# Patient Record
Sex: Female | Born: 1970 | Race: Black or African American | Hispanic: No | Marital: Single | Smoking: Former smoker
Health system: Southern US, Community
[De-identification: ages and names within clinical notes are randomized; demographics above are authoritative.]

## PROBLEM LIST (undated history)

## (undated) DIAGNOSIS — M797 Fibromyalgia: Secondary | ICD-10-CM

## (undated) DIAGNOSIS — L309 Dermatitis, unspecified: Secondary | ICD-10-CM

## (undated) DIAGNOSIS — F4310 Post-traumatic stress disorder, unspecified: Secondary | ICD-10-CM

## (undated) DIAGNOSIS — J45909 Unspecified asthma, uncomplicated: Secondary | ICD-10-CM

## (undated) HISTORY — PX: HERNIA REPAIR: SHX51

## (undated) HISTORY — PX: ABDOMINAL HYSTERECTOMY: SHX81

## (undated) HISTORY — PX: WRIST SURGERY: SHX841

---

## 2002-07-10 ENCOUNTER — Emergency Department (HOSPITAL_COMMUNITY): Admission: EM | Admit: 2002-07-10 | Discharge: 2002-07-10 | Payer: Self-pay | Admitting: Emergency Medicine

## 2020-01-05 ENCOUNTER — Encounter (HOSPITAL_BASED_OUTPATIENT_CLINIC_OR_DEPARTMENT_OTHER): Payer: Self-pay

## 2020-01-05 ENCOUNTER — Emergency Department (HOSPITAL_BASED_OUTPATIENT_CLINIC_OR_DEPARTMENT_OTHER)
Admission: EM | Admit: 2020-01-05 | Discharge: 2020-01-05 | Disposition: A | Discharge status: Home / Self Care | Payer: Medicare Other | Attending: Emergency Medicine | Admitting: Emergency Medicine

## 2020-01-05 ENCOUNTER — Emergency Department (HOSPITAL_BASED_OUTPATIENT_CLINIC_OR_DEPARTMENT_OTHER): Payer: Medicare Other

## 2020-01-05 ENCOUNTER — Other Ambulatory Visit: Payer: Self-pay

## 2020-01-05 DIAGNOSIS — M545 Low back pain: Secondary | ICD-10-CM | POA: Insufficient documentation

## 2020-01-05 DIAGNOSIS — J45909 Unspecified asthma, uncomplicated: Secondary | ICD-10-CM | POA: Diagnosis not present

## 2020-01-05 DIAGNOSIS — M25561 Pain in right knee: Secondary | ICD-10-CM | POA: Insufficient documentation

## 2020-01-05 DIAGNOSIS — M542 Cervicalgia: Secondary | ICD-10-CM | POA: Insufficient documentation

## 2020-01-05 DIAGNOSIS — Z91040 Latex allergy status: Secondary | ICD-10-CM | POA: Diagnosis not present

## 2020-01-05 DIAGNOSIS — G8929 Other chronic pain: Secondary | ICD-10-CM | POA: Diagnosis not present

## 2020-01-05 DIAGNOSIS — M25512 Pain in left shoulder: Secondary | ICD-10-CM | POA: Insufficient documentation

## 2020-01-05 DIAGNOSIS — F17210 Nicotine dependence, cigarettes, uncomplicated: Secondary | ICD-10-CM | POA: Diagnosis not present

## 2020-01-05 HISTORY — DX: Dermatitis, unspecified: L30.9

## 2020-01-05 HISTORY — DX: Unspecified asthma, uncomplicated: J45.909

## 2020-01-05 HISTORY — DX: Fibromyalgia: M79.7

## 2020-01-05 HISTORY — DX: Post-traumatic stress disorder, unspecified: F43.10

## 2020-01-05 MED ORDER — DICLOFENAC SODIUM 1 % EX GEL: 2.0000 g | Freq: Four times a day (QID) | CUTANEOUS | 0 refills | Status: DC | PRN

## 2020-01-05 NOTE — ED Triage Notes (Signed)
Pt c/o pain to right knee, lower back, left shoulder and left side of neck x 3 days--denies injury to any site-NAD-slow limping gait

## 2020-01-05 NOTE — Discharge Instructions (Signed)
You were seen in the emergency room today with worsening pain in multiple locations including the right knee.  Your x-ray is normal.  I suspect that this is your fibromyalgia pain flaring up.  Your primary care doctor is trying to schedule a telehealth visit with you to discuss your pain management.  I will defer any additional pain medicines to them.  I am sending you home with a prescription for topical pain medications.  Please take these as directed and call your primary care doctor to arrange telehealth visit tomorrow.  In review of their telephone notes from today they can set you up with a same-day appointment.

## 2020-01-05 NOTE — ED Provider Notes (Signed)
Emergency Department Provider Note   I have reviewed the triage vital signs and the nursing notes.   HISTORY  Chief Complaint Multiple pain site c/o   HPI Tonya Chase is a 49 y.o. female with past medical history of chronic pain, fibromyalgia, eczema, asthma, presents to the emergency department with acute on chronic pain in the right knee, lower back, left shoulder.  Patient states that the pain in the lower back and left shoulder along with the left side of the neck feels typical of her fibromyalgia discomfort.  She has been having it over the past 3 days.  Patient states that she is been taking Lyrica but that her PCP took her off of a lot of her other pain medications approximately 1 year ago and she thinks she should be back on those.   In addition to her fibromyalgia type pain she is having intermittent severe pain in the left knee.  She feels this is mostly along the middle of the knee.  Denies known injury.  No fevers or chills.  No joint swelling.  She is ambulatory with a limp.    Past Medical History:  Diagnosis Date  . Asthma   . Eczema   . Fibromyalgia   . PTSD (post-traumatic stress disorder)     There are no problems to display for this patient.   Past Surgical History:  Procedure Laterality Date  . ABDOMINAL HYSTERECTOMY    . CESAREAN SECTION    . HERNIA REPAIR    . WRIST SURGERY      Allergies Contrast media [iodinated diagnostic agents], Latex, and Other  No family history on file.  Social History Social History   Tobacco Use  . Smoking status: Current Every Day Smoker    Types: Cigarettes  . Smokeless tobacco: Never Used  Substance Use Topics  . Alcohol use: Never  . Drug use: Never    Review of Systems  Constitutional: No fever/chills Cardiovascular: Denies chest pain. Respiratory: Denies shortness of breath. Gastrointestinal: No abdominal pain.  Genitourinary: Negative for dysuria. Musculoskeletal: Positive lower back and  left shoulder/neck pain. Positive right knee pain.  Skin: Negative for rash. Neurological: Negative for headaches, focal weakness or numbness.  10-point ROS otherwise negative.  ____________________________________________   PHYSICAL EXAM:  VITAL SIGNS: ED Triage Vitals  Enc Vitals Group     BP 01/05/20 2031 (!) 135/107     Pulse Rate 01/05/20 2031 85     Resp 01/05/20 2031 18     Temp 01/05/20 2031 98.4 F (36.9 C)     Temp Source 01/05/20 2031 Oral     SpO2 01/05/20 2031 100 %     Weight 01/05/20 2030 140 lb (63.5 kg)     Height 01/05/20 2030 5' (1.524 m)   Constitutional: Alert and oriented. Well appearing and in no acute distress. Eyes: Conjunctivae are normal.  Head: Atraumatic. Nose: No congestion/rhinnorhea. Mouth/Throat: Mucous membranes are moist.  Oropharynx non-erythematous. Neck: No stridor.   Cardiovascular: Normal rate, regular rhythm. Good peripheral circulation. Grossly normal heart sounds.   Respiratory: Normal respiratory effort.  No retractions. Lungs CTAB. Gastrointestinal: Soft and nontender. No distention.  Musculoskeletal: No lower extremity tenderness nor edema. No gross deformities of extremities. No joint effusion or edema.  Neurologic:  Normal speech and language. No gross focal neurologic deficits are appreciated.  Skin:  Skin is warm, dry and intact. No rash noted.  ____________________________________________  RADIOLOGY  DG Knee 2 Views Right  Result Date: 01/05/2020  CLINICAL DATA:  Right knee pain for 3 days EXAM: RIGHT KNEE - 1-2 VIEW COMPARISON:  None. FINDINGS: Frontal and lateral views of the right knee demonstrate no acute displaced fractures. Joint spaces are well preserved. No joint effusion. Soft tissues are unremarkable. IMPRESSION: 1. Unremarkable right knee. Electronically Signed   By: Sharlet Salina M.D.   On: 01/05/2020 22:06    ____________________________________________   PROCEDURES  Procedure(s) performed:    Procedures  None ____________________________________________   INITIAL IMPRESSION / ASSESSMENT AND PLAN / ED COURSE  Pertinent labs & imaging results that were available during my care of the patient were reviewed by me and considered in my medical decision making (see chart for details).   Patient presents to the ED with acute on chronic pain. Plain film of the right knee without acute finding. Discussed with patient that her PCP is attempting to set up a TH visit with her after review of phone encounters in Epic. She will call tomorrow and get on the [facility] schedule. No indication on exam today to suspect septic joint or other emergent issue here.    ____________________________________________  FINAL CLINICAL IMPRESSION(S) / ED DIAGNOSES  Final diagnoses:  Acute pain of right knee     NEW OUTPATIENT MEDICATIONS STARTED DURING THIS VISIT:  Discharge Medication List as of 01/05/2020 10:26 PM    START taking these medications   Details  diclofenac Sodium (VOLTAREN) 1 % GEL Apply 2 g topically 4 (four) times daily as needed (pain)., Starting Wed 01/05/2020, Print        Note:  This document was prepared using Dragon voice recognition software and may include unintentional dictation errors.  Alona Bene, MD, [facility] Emergency Medicine    Alpheus Stiff, Arlyss Repress, MD 01/07/20 1344

## 2020-06-21 ENCOUNTER — Encounter (HOSPITAL_BASED_OUTPATIENT_CLINIC_OR_DEPARTMENT_OTHER): Payer: Self-pay

## 2020-06-21 ENCOUNTER — Other Ambulatory Visit: Payer: Self-pay

## 2020-06-21 ENCOUNTER — Emergency Department (HOSPITAL_BASED_OUTPATIENT_CLINIC_OR_DEPARTMENT_OTHER)
Admission: EM | Admit: 2020-06-21 | Discharge: 2020-06-21 | Disposition: A | Discharge status: Home / Self Care | Payer: Medicare Other | Attending: Emergency Medicine | Admitting: Emergency Medicine

## 2020-06-21 ENCOUNTER — Encounter (HOSPITAL_BASED_OUTPATIENT_CLINIC_OR_DEPARTMENT_OTHER): Payer: Self-pay | Admitting: *Deleted

## 2020-06-21 DIAGNOSIS — R21 Rash and other nonspecific skin eruption: Secondary | ICD-10-CM | POA: Insufficient documentation

## 2020-06-21 DIAGNOSIS — Z91040 Latex allergy status: Secondary | ICD-10-CM | POA: Insufficient documentation

## 2020-06-21 DIAGNOSIS — L309 Dermatitis, unspecified: Secondary | ICD-10-CM | POA: Insufficient documentation

## 2020-06-21 DIAGNOSIS — F17210 Nicotine dependence, cigarettes, uncomplicated: Secondary | ICD-10-CM | POA: Insufficient documentation

## 2020-06-21 DIAGNOSIS — J45909 Unspecified asthma, uncomplicated: Secondary | ICD-10-CM | POA: Insufficient documentation

## 2020-06-21 DIAGNOSIS — Z5321 Procedure and treatment not carried out due to patient leaving prior to being seen by health care provider: Secondary | ICD-10-CM | POA: Insufficient documentation

## 2020-06-21 NOTE — ED Triage Notes (Signed)
Rash and itching x 2 days, sees dermatology for same

## 2020-06-21 NOTE — ED Triage Notes (Signed)
Pt c/o "itching all over-skin allergies" x 2 days-NAD-steady gait

## 2020-06-22 ENCOUNTER — Emergency Department (HOSPITAL_BASED_OUTPATIENT_CLINIC_OR_DEPARTMENT_OTHER)
Admission: EM | Admit: 2020-06-22 | Discharge: 2020-06-22 | Disposition: A | Discharge status: Home / Self Care | Payer: Medicare Other | Source: Home / Self Care | Attending: Emergency Medicine | Admitting: Emergency Medicine

## 2020-06-22 DIAGNOSIS — L309 Dermatitis, unspecified: Secondary | ICD-10-CM

## 2020-06-22 NOTE — Discharge Instructions (Addendum)
I do NOT see any evidence of scabies on your body. It is primarily a visual diagnosis. Please see attached handout. If you start noticing those symptoms in the key areas, see your primary doctor for further management.

## 2020-06-22 NOTE — ED Provider Notes (Signed)
[facility] EMERGENCY DEPARTMENT Provider Note   CSN: [id number] Arrival date & time: 06/21/20  2113     History Chief Complaint  Patient presents with  . Rash    Tonya Chase is a 50 y.o. female.  Had intermittent skin to skin contact with somebody who called her today is that she has scabies.  Said that she needed to get checked for scabies.  Patient does not have any new symptoms besides her eczematous symptoms.    Rash Location:  Full body Quality: not blistering   Severity:  Mild Timing:  Constant Progression:  Unchanged Chronicity:  New Context: not insect bite/sting   Relieved by:  None tried Worsened by:  Nothing Ineffective treatments:  None tried Associated symptoms: no abdominal pain        Past Medical History:  Diagnosis Date  . Asthma   . Eczema   . Fibromyalgia   . PTSD (post-traumatic stress disorder)     There are no problems to display for this patient.   Past Surgical History:  Procedure Laterality Date  . ABDOMINAL HYSTERECTOMY    . CESAREAN SECTION    . HERNIA REPAIR    . WRIST SURGERY       OB History   No obstetric history on file.     No family history on file.  Social History   Tobacco Use  . Smoking status: Current Every Day Smoker    Types: Cigarettes  . Smokeless tobacco: Never Used  Vaping Use  . Vaping Use: Never used  Substance Use Topics  . Alcohol use: Never  . Drug use: Never    Home Medications Prior to Admission medications   Medication Sig Start Date End Date Taking? Authorizing Provider  diclofenac Sodium (VOLTAREN) 1 % GEL Apply 2 g topically 4 (four) times daily as needed (pain). 01/05/20   Long, Arlyss Repress, MD    Allergies    Contrast media [iodinated diagnostic agents], Latex, and Other  Review of Systems   Review of Systems  Gastrointestinal: Negative for abdominal pain.  Skin: Positive for rash.  All other systems reviewed and are negative.   Physical Exam Updated Vital  Signs BP 126/79   Pulse 69   Temp 98.2 F (36.8 C)   Resp 16   Ht 5' (1.524 m)   Wt 54 kg   SpO2 100%   BMI 23.24 kg/m   Physical Exam Vitals and nursing note reviewed.  Constitutional:      Appearance: She is well-developed and well-nourished.  HENT:     Head: Normocephalic and atraumatic.     Mouth/Throat:     Pharynx: Oropharynx is clear.  Eyes:     Pupils: Pupils are equal, round, and reactive to light.  Cardiovascular:     Rate and Rhythm: Normal rate and regular rhythm.  Pulmonary:     Effort: No respiratory distress.     Breath sounds: No stridor.  Abdominal:     General: There is no distension.  Musculoskeletal:        General: No swelling or tenderness. Normal range of motion.     Cervical back: Normal range of motion.  Skin:    General: Skin is warm and dry.     Coloration: Skin is not jaundiced or pale.     Findings: Rash ( Diffuse eczematous rash.  No evidence of burrowing, mites or insect bites around her hands wrists arms armpits collar or other common places such as waistband.  Did not inspect her groin area.) present.  Neurological:     General: No focal deficit present.     Mental Status: She is alert.     ED Results / Procedures / Treatments   Labs (all labs ordered are listed, but only abnormal results are displayed) Labs Reviewed - No data to display  EKG None  Radiology No results found.  Procedures Procedures (including critical care time)  Medications Ordered in ED Medications - No data to display  ED Course  I have reviewed the triage vital signs and the nursing notes.  Pertinent labs & imaging results that were available during my care of the patient were reviewed by me and considered in my medical decision making (see chart for details).    MDM Rules/Calculators/A&P                          No evidence of scabies.  Just her normal eczema.  She will follow-up with her doctor if she notices any new lesions.  Final Clinical  Impression(s) / ED Diagnoses Final diagnoses:  Eczema, unspecified type    Rx / DC Orders ED Discharge Orders    None       Suheyb Raucci, Barbara Cower, MD 06/22/20 [phone]

## 2020-07-05 ENCOUNTER — Other Ambulatory Visit: Payer: Self-pay

## 2020-07-05 ENCOUNTER — Encounter (HOSPITAL_BASED_OUTPATIENT_CLINIC_OR_DEPARTMENT_OTHER): Payer: Self-pay | Admitting: *Deleted

## 2020-07-05 ENCOUNTER — Emergency Department (HOSPITAL_BASED_OUTPATIENT_CLINIC_OR_DEPARTMENT_OTHER)
Admission: EM | Admit: 2020-07-05 | Discharge: 2020-07-05 | Disposition: A | Discharge status: Home / Self Care | Payer: Medicare Other | Attending: Emergency Medicine | Admitting: Emergency Medicine

## 2020-07-05 DIAGNOSIS — J45909 Unspecified asthma, uncomplicated: Secondary | ICD-10-CM | POA: Diagnosis not present

## 2020-07-05 DIAGNOSIS — Z20822 Contact with and (suspected) exposure to covid-19: Secondary | ICD-10-CM | POA: Insufficient documentation

## 2020-07-05 DIAGNOSIS — R059 Cough, unspecified: Secondary | ICD-10-CM | POA: Diagnosis not present

## 2020-07-05 DIAGNOSIS — F17210 Nicotine dependence, cigarettes, uncomplicated: Secondary | ICD-10-CM | POA: Insufficient documentation

## 2020-07-05 DIAGNOSIS — Z91040 Latex allergy status: Secondary | ICD-10-CM | POA: Insufficient documentation

## 2020-07-05 NOTE — ED Triage Notes (Signed)
C/o non pro cough  X 6 days ago

## 2020-07-05 NOTE — ED Provider Notes (Signed)
[facility] EMERGENCY DEPARTMENT Provider Note   CSN: [id number] Arrival date & time: 07/05/20  1837     History No chief complaint on file.   Tonya Chase is a 50 y.o. female.  The history is provided by the patient.  URI Presenting symptoms: cough   Presenting symptoms: no ear pain, no fever and no sore throat   Cough:    Cough characteristics:  Non-productive   Severity:  Mild   Onset quality:  Gradual   Timing:  Intermittent   Chronicity:  New Severity:  Mild Onset quality:  Gradual Timing:  Intermittent Progression:  Waxing and waning Chronicity:  New Relieved by:  Nothing Worsened by:  Nothing Associated symptoms: no arthralgias        Past Medical History:  Diagnosis Date  . Asthma   . Eczema   . Fibromyalgia   . PTSD (post-traumatic stress disorder)     There are no problems to display for this patient.   Past Surgical History:  Procedure Laterality Date  . ABDOMINAL HYSTERECTOMY    . CESAREAN SECTION    . HERNIA REPAIR    . WRIST SURGERY       OB History   No obstetric history on file.     No family history on file.  Social History   Tobacco Use  . Smoking status: Current Every Day Smoker    Types: Cigarettes  . Smokeless tobacco: Never Used  Vaping Use  . Vaping Use: Never used  Substance Use Topics  . Alcohol use: Never  . Drug use: Never    Home Medications Prior to Admission medications   Medication Sig Start Date End Date Taking? Authorizing Provider  diclofenac Sodium (VOLTAREN) 1 % GEL Apply 2 g topically 4 (four) times daily as needed (pain). 01/05/20   Long, Arlyss Repress, MD    Allergies    Contrast media [iodinated diagnostic agents], Latex, and Other  Review of Systems   Review of Systems  Constitutional: Negative for chills and fever.  HENT: Negative for ear pain and sore throat.   Eyes: Negative for pain and visual disturbance.  Respiratory: Positive for cough. Negative for shortness of breath.    Cardiovascular: Negative for chest pain and palpitations.  Gastrointestinal: Negative for abdominal pain and vomiting.  Genitourinary: Negative for dysuria and hematuria.  Musculoskeletal: Negative for arthralgias and back pain.  Skin: Negative for color change and rash.  Neurological: Negative for seizures and syncope.  All other systems reviewed and are negative.   Physical Exam Updated Vital Signs BP 136/86   Pulse 94   Temp 98.1 F (36.7 C)   Resp 16   Ht 5' (1.524 m)   Wt 54 kg   SpO2 100%   BMI 23.24 kg/m   Physical Exam Vitals and nursing note reviewed.  Constitutional:      General: She is not in acute distress.    Appearance: She is well-developed and well-nourished.  HENT:     Head: Normocephalic and atraumatic.  Eyes:     Conjunctiva/sclera: Conjunctivae normal.  Cardiovascular:     Rate and Rhythm: Normal rate and regular rhythm.     Heart sounds: No murmur heard.   Pulmonary:     Effort: Pulmonary effort is normal. No respiratory distress.     Breath sounds: Normal breath sounds.  Abdominal:     Palpations: Abdomen is soft.     Tenderness: There is no abdominal tenderness.  Musculoskeletal:  General: No edema.     Cervical back: Neck supple.  Skin:    General: Skin is warm and dry.  Neurological:     Mental Status: She is alert.  Psychiatric:        Mood and Affect: Mood and affect normal.     ED Results / Procedures / Treatments   Labs (all labs ordered are listed, but only abnormal results are displayed) Labs Reviewed  SARS CORONAVIRUS 2 (TAT 6-24 HRS)    EKG None  Radiology No results found.  Procedures Procedures (including critical care time)  Medications Ordered in ED Medications - No data to display  ED Course  I have reviewed the triage vital signs and the nursing notes.  Pertinent labs & imaging results that were available during my care of the patient were reviewed by me and considered in my medical decision  making (see chart for details).    MDM Rules/Calculators/A&P                           Tonya Chase [age] with history of asthma who presents to the ED with cough.  Normal vitals.  No fever.  No respiratory distress. No SOB or CP. No concerns for ACS or PE. Here for COVID test.  Overall mild symptoms.  No signs of respiratory distress and normal work of breathing.  Tested for COVID.  Likely close contact.  Discharged in good condition.  Understands to request.  This chart was dictated using voice recognition software.  Despite best efforts to proofread,  errors can occur which can change the documentation meaning.   Tonya Chase was evaluated in Emergency Department on 07/05/2020 for the symptoms described in the history of present illness. She was evaluated in the context of the global COVID-19 pandemic, which necessitated consideration that the patient might be at risk for infection with the SARS-CoV-2 virus that causes COVID-19. Institutional protocols and algorithms that pertain to the evaluation of patients at risk for COVID-19 are in a state of rapid change based on information released by regulatory bodies including the CDC and federal and state organizations. These policies and algorithms were followed during the patient's care in the ED.     Final Clinical Impression(s) / ED Diagnoses Final diagnoses:  Cough    Rx / DC Orders ED Discharge Orders    None       Virgina Norfolk, DO 07/05/20 2004

## 2020-07-06 LAB — SARS CORONAVIRUS 2 (TAT 6-24 HRS): SARS Coronavirus 2: NEGATIVE

## 2020-10-20 ENCOUNTER — Other Ambulatory Visit (HOSPITAL_BASED_OUTPATIENT_CLINIC_OR_DEPARTMENT_OTHER): Payer: Self-pay

## 2020-10-20 ENCOUNTER — Emergency Department (HOSPITAL_BASED_OUTPATIENT_CLINIC_OR_DEPARTMENT_OTHER)
Admission: EM | Admit: 2020-10-20 | Discharge: 2020-10-20 | Disposition: A | Discharge status: Home / Self Care | Payer: Medicare Other | Attending: Emergency Medicine | Admitting: Emergency Medicine

## 2020-10-20 ENCOUNTER — Other Ambulatory Visit: Payer: Self-pay

## 2020-10-20 ENCOUNTER — Encounter (HOSPITAL_BASED_OUTPATIENT_CLINIC_OR_DEPARTMENT_OTHER): Payer: Self-pay | Admitting: Urology

## 2020-10-20 DIAGNOSIS — R21 Rash and other nonspecific skin eruption: Secondary | ICD-10-CM | POA: Diagnosis present

## 2020-10-20 DIAGNOSIS — L235 Allergic contact dermatitis due to other chemical products: Secondary | ICD-10-CM | POA: Diagnosis not present

## 2020-10-20 DIAGNOSIS — J45909 Unspecified asthma, uncomplicated: Secondary | ICD-10-CM | POA: Insufficient documentation

## 2020-10-20 DIAGNOSIS — Z91040 Latex allergy status: Secondary | ICD-10-CM | POA: Diagnosis not present

## 2020-10-20 DIAGNOSIS — Z87891 Personal history of nicotine dependence: Secondary | ICD-10-CM | POA: Insufficient documentation

## 2020-10-20 MED ORDER — PREDNISONE 20 MG PO TABS
40.0000 mg | ORAL_TABLET | Freq: Every day | ORAL | 0 refills | Status: DC
  Filled 2020-10-20: qty 10, 5d supply, fill #0

## 2020-10-20 MED ORDER — HYDROXYZINE HCL 25 MG PO TABS
25.0000 mg | ORAL_TABLET | Freq: Three times a day (TID) | ORAL | 0 refills | Status: AC | PRN
  Filled 2020-10-20: qty 30, 10d supply, fill #0

## 2020-10-20 MED ORDER — EUCERIN EX CREA
TOPICAL_CREAM | Freq: Two times a day (BID) | CUTANEOUS | 0 refills | Status: DC
Start: 2020-10-20 — End: 2022-01-26
  Filled 2020-10-20: qty 240, 30d supply, fill #0

## 2020-10-20 NOTE — ED Provider Notes (Signed)
[facility] EMERGENCY DEPARTMENT Provider Note   CSN: [id number] Arrival date & time: 10/20/20  9242     History Chief Complaint  Patient presents with  . Rash to Hands    Tonya Chase is a 50 y.o. female.  Patient is a 50 year old female with a history of PTSD, fibromyalgia, asthma and eczema who is presenting today with 4 to 5 days of worsening rash on bilateral hands with itching and swelling.  Patient reports that this started after being part of several funerals being in contact with a lot of people and doing excessive cleaning with multiple chemicals and wearing gloves repeatedly.  She has tried putting antifungal cream on her hands and using hydroxyzine but is not getting any better.  She is now starting to feel like she is itching all over her body.  She denies any shortness of breath, asthma-like symptoms.  She has had no fevers.  No contact with anything like poison ivy or poison oak.  The history is provided by the patient.       Past Medical History:  Diagnosis Date  . Asthma   . Eczema   . Fibromyalgia   . PTSD (post-traumatic stress disorder)     There are no problems to display for this patient.   Past Surgical History:  Procedure Laterality Date  . ABDOMINAL HYSTERECTOMY    . CESAREAN SECTION    . HERNIA REPAIR    . WRIST SURGERY       OB History   No obstetric history on file.     History reviewed. No pertinent family history.  Social History   Tobacco Use  . Smoking status: Former Games developer  . Smokeless tobacco: Never Used  Vaping Use  . Vaping Use: Every day  Substance Use Topics  . Alcohol use: Never  . Drug use: Never    Home Medications Prior to Admission medications   Medication Sig Start Date End Date Taking? Authorizing Provider  predniSONE (DELTASONE) 20 MG tablet Take 2 tablets (40 mg total) by mouth daily. 10/20/20  Yes Brena Windsor, Alphonzo Lemmings, MD  Triamcinolone Acetonide (TRIAMCINOLONE 0.1 % CREAM : EUCERIN) CREA Apply 1  application topically 2 (two) times daily. 10/20/20  Yes Gwyneth Sprout, MD  diclofenac Sodium (VOLTAREN) 1 % GEL Apply 2 g topically 4 (four) times daily as needed (pain). 01/05/20   Long, Arlyss Repress, MD  hydrOXYzine (ATARAX/VISTARIL) 25 MG tablet Take 1 tablet (25 mg total) by mouth 3 (three) times daily as needed. 10/20/20   Gwyneth Sprout, MD    Allergies    Contrast media [iodinated diagnostic agents], Latex, and Other  Review of Systems   Review of Systems  Skin:       Also she has noticed skin breakdown in between her toes on both feet where it is moist and sometimes weeps.  She has been putting athletes foot cream on this for the last 3 weeks  All other systems reviewed and are negative.   Physical Exam Updated Vital Signs BP 127/70 (BP Location: Right Arm)   Pulse 67   Temp 98.3 F (36.8 C) (Oral)   Resp 20   Ht 5' (1.524 m)   Wt 55.3 kg   SpO2 100%   BMI 23.83 kg/m   Physical Exam Vitals and nursing note reviewed.  Constitutional:      General: She is not in acute distress.    Appearance: She is well-developed.  HENT:     Head: Normocephalic and atraumatic.  Eyes:     Pupils: Pupils are equal, round, and reactive to light.  Cardiovascular:     Rate and Rhythm: Normal rate.  Pulmonary:     Effort: Pulmonary effort is normal. No respiratory distress.  Musculoskeletal:        General: No tenderness. Normal range of motion.     Comments: No edema  Skin:    General: Skin is warm and dry.     Findings: Rash present.     Comments: Bilateral hands have red erythematous papules, excoriation and mild swelling.  Mild given thickening of bilateral hands as well.  Some palm involvement but mostly the dorsum of the hands and in between the digits. Secondly in between the toes is skin peeling, moisture and breakdown.  No significant erythema or skin thickening.  Neurological:     Mental Status: She is alert and oriented to person, place, and time. Mental status is at  baseline.     Cranial Nerves: No cranial nerve deficit.  Psychiatric:        Mood and Affect: Mood normal.        Behavior: Behavior normal.     ED Results / Procedures / Treatments   Labs (all labs ordered are listed, but only abnormal results are displayed) Labs Reviewed - No data to display  EKG None  Radiology No results found.  Procedures Procedures   Medications Ordered in ED Medications - No data to display  ED Course  I have reviewed the triage vital signs and the nursing notes.  Pertinent labs & imaging results that were available during my care of the patient were reviewed by me and considered in my medical decision making (see chart for details).    MDM Rules/Calculators/A&P                          Pleasant 50 year old female presenting today with what appears to be contact dermatitis of bilateral hands.  Most likely related to some type of chemical exposure from excessive cleaning over the last week due to having multiple people she is cleaning up after and being at 2 different funerals.  It does not appear fungal in nature or bacterial.  Will give triamcinolone cream.  However patient also is having diffuse itching and rash may be related to stress.  Will give a short course of steroids.  Patient also has fungal infection between the toes and encouraged her to continue to use athletes foot cream that she has and follow-up with her doctor if not getting better.   Final Clinical Impression(s) / ED Diagnoses Final diagnoses:  Allergic dermatitis due to other chemical product    Rx / DC Orders ED Discharge Orders         Ordered    hydrOXYzine (ATARAX/VISTARIL) 25 MG tablet  3 times daily PRN        10/20/20 1000    predniSONE (DELTASONE) 20 MG tablet  Daily        10/20/20 1000    Triamcinolone Acetonide (TRIAMCINOLONE 0.1 % CREAM : EUCERIN) CREA  2 times daily        10/20/20 1000           Gwyneth Sprout, MD 10/20/20 1016

## 2020-10-20 NOTE — ED Triage Notes (Addendum)
Bumpy raised rash to bilateral hands that started 4 days ago, States h/0 skin issues, takes hydroxyzine tid with no relief. Reports itching, states similar rash to bilateral feet, rash not seen on feet at this time.  States flare up of rash noted after Stressful event of father passing away last week

## 2021-01-14 ENCOUNTER — Emergency Department (HOSPITAL_BASED_OUTPATIENT_CLINIC_OR_DEPARTMENT_OTHER): Payer: Medicare Other

## 2021-01-14 ENCOUNTER — Encounter (HOSPITAL_BASED_OUTPATIENT_CLINIC_OR_DEPARTMENT_OTHER): Payer: Self-pay | Admitting: Emergency Medicine

## 2021-01-14 ENCOUNTER — Other Ambulatory Visit: Payer: Self-pay

## 2021-01-14 ENCOUNTER — Emergency Department (HOSPITAL_BASED_OUTPATIENT_CLINIC_OR_DEPARTMENT_OTHER)
Admission: EM | Admit: 2021-01-14 | Discharge: 2021-01-14 | Disposition: A | Discharge status: Home / Self Care | Payer: Medicare Other | Attending: Emergency Medicine | Admitting: Emergency Medicine

## 2021-01-14 DIAGNOSIS — M542 Cervicalgia: Secondary | ICD-10-CM

## 2021-01-14 DIAGNOSIS — M546 Pain in thoracic spine: Secondary | ICD-10-CM | POA: Insufficient documentation

## 2021-01-14 DIAGNOSIS — M5412 Radiculopathy, cervical region: Secondary | ICD-10-CM

## 2021-01-14 DIAGNOSIS — Z91040 Latex allergy status: Secondary | ICD-10-CM | POA: Diagnosis not present

## 2021-01-14 DIAGNOSIS — Z87891 Personal history of nicotine dependence: Secondary | ICD-10-CM | POA: Insufficient documentation

## 2021-01-14 DIAGNOSIS — J45909 Unspecified asthma, uncomplicated: Secondary | ICD-10-CM | POA: Diagnosis not present

## 2021-01-14 MED ORDER — PREDNISONE 20 MG PO TABS: ORAL_TABLET | ORAL | 0 refills | Status: DC

## 2021-01-14 MED ORDER — KETOROLAC TROMETHAMINE 15 MG/ML IJ SOLN
30.0000 mg | Freq: Once | INTRAMUSCULAR | Status: AC
  Administered 2021-01-14: 30 mg via INTRAMUSCULAR
  Filled 2021-01-14: qty 2

## 2021-01-14 NOTE — ED Provider Notes (Signed)
[facility] EMERGENCY DEPARTMENT Provider Note   CSN: [id number] Arrival date & time: 01/14/21  1056     History No chief complaint on file.   Tonya Chase is a 50 y.o. female.  Patient presents to the emergency department today for evaluation of posterior neck pain and upper back pain.  This has been an ongoing problem for the patient.  Per her report and per her records it appears that she has seen [facility] medical clinic as well as her primary care provider in [place] for this problem.  She reports pain that started at the base of her posterior neck about 4 weeks ago.  She also points to the bilateral upper back, trapezius area, as the area where is the pain is worse.  Today she developed pain into the "bone" of the left upper arm and down into her left hand.  No associated weakness.  She also reports headache pain along the side of her left face and head.  She states that she feels "foggy" and has had decreased energy.  She states "I want my life back" and is tearful during H&P.  She denies any vision loss or changes. Patient denies signs of stroke including: facial droop, slurred speech, aphasia, weakness/numbness in extremities, imbalance/trouble walking.  Per patient report and PCP notes, she has been given steroid shots at an urgent care, multiple muscle relaxer medications with concern for overmedication.  She was also started on duloxetine at [facility] pain clinic.  PCP per appointment on 7/28 wanted her to stop Zanaflex, take only Flexeril at night.  They are to follow-up with her.  Per patient report, she has not had any imaging of her neck yet.  No history of surgeries.      Past Medical History:  Diagnosis Date   Asthma    Eczema    Fibromyalgia    PTSD (post-traumatic stress disorder)     There are no problems to display for this patient.   Past Surgical History:  Procedure Laterality Date   ABDOMINAL HYSTERECTOMY     CESAREAN SECTION     HERNIA  REPAIR     WRIST SURGERY       OB History   No obstetric history on file.     History reviewed. No pertinent family history.  Social History   Tobacco Use   Smoking status: Former   Smokeless tobacco: Never  Building services engineer Use: Every day  Substance Use Topics   Alcohol use: Never   Drug use: Never    Home Medications Prior to Admission medications   Medication Sig Start Date End Date Taking? Authorizing Provider  diclofenac Sodium (VOLTAREN) 1 % GEL Apply 2 g topically 4 (four) times daily as needed (pain). 01/05/20   Long, Arlyss Repress, MD  eucerin-triamcinolone cream Apply 1 application on to the skin 2 (two) times daily. 10/20/20   Gwyneth Sprout, MD  hydrOXYzine (ATARAX/VISTARIL) 25 MG tablet Take 1 tablet (25 mg total) by mouth 3 (three) times daily as needed. 10/20/20   Gwyneth Sprout, MD  predniSONE (DELTASONE) 20 MG tablet Take 2 tablets (40 mg total) by mouth daily. 10/20/20   Gwyneth Sprout, MD    Allergies    Contrast media [iodinated diagnostic agents], Latex, and Other  Review of Systems   Review of Systems  Constitutional:  Negative for fever.  HENT:  Negative for rhinorrhea and sore throat.   Eyes:  Negative for visual disturbance.  Respiratory:  Negative for  cough and shortness of breath.   Cardiovascular:  Negative for chest pain.  Gastrointestinal:  Negative for abdominal pain, diarrhea, nausea and vomiting.  Genitourinary:  Negative for dysuria, frequency, hematuria and urgency.  Musculoskeletal:  Positive for myalgias and neck pain.  Skin:  Positive for rash (chronic eczema).  Neurological:  Positive for headaches. Negative for facial asymmetry, speech difficulty, weakness and numbness.   Physical Exam Updated Vital Signs BP 130/82 (BP Location: Right Arm)   Pulse (!) 101   Temp 98.4 F (36.9 C) (Oral)   Resp 20   Ht 5' (1.524 m)   Wt 56.7 kg   SpO2 99%   BMI 24.41 kg/m   Physical Exam Vitals and nursing note reviewed.   Constitutional:      General: She is not in acute distress.    Appearance: She is well-developed.  HENT:     Head: Normocephalic and atraumatic.     Right Ear: External ear normal.     Left Ear: External ear normal.     Nose: Nose normal.  Eyes:     Conjunctiva/sclera: Conjunctivae normal.  Neck:     Comments: No carotid bruit Cardiovascular:     Rate and Rhythm: Normal rate and regular rhythm.     Heart sounds: No murmur heard. Pulmonary:     Effort: No respiratory distress.     Breath sounds: No wheezing, rhonchi or rales.  Abdominal:     Palpations: Abdomen is soft.     Tenderness: There is no abdominal tenderness. There is no guarding or rebound.  Musculoskeletal:     Cervical back: Normal range of motion and neck supple. Tenderness present.     Thoracic back: Tenderness present.     Lumbar back: No tenderness.       Back:     Right lower leg: No edema.     Left lower leg: No edema.     Comments: Patient winces and appears uncomfortable with palpation of the bilateral paraspinous muscular area of the cervical spine as well as the upper thoracic spine.  During exam as patient is talking, she voluntarily began frequently shakes her head yes and no without any apparent discomfort or difficulty.  Skin:    General: Skin is warm and dry.     Findings: No rash.  Neurological:     General: No focal deficit present.     Mental Status: She is alert. Mental status is at baseline.     Motor: No weakness.  Psychiatric:        Mood and Affect: Mood normal.    ED Results / Procedures / Treatments   Labs (all labs ordered are listed, but only abnormal results are displayed) Labs Reviewed - No data to display  EKG EKG Interpretation  Date/Time:  Sunday January 14 2021 11:25:23 EDT Ventricular Rate:  96 PR Interval:  124 QRS Duration: 76 QT Interval:  366 QTC Calculation: 462 R Axis:   78 Text Interpretation: Normal sinus rhythm Normal ECG Normal axis No acute ischemia  Confirmed by Pieter Partridge ([id number]) on 01/14/2021 11:26:34 AM  Radiology DG Cervical Spine Complete  Result Date: 01/14/2021 CLINICAL DATA:  Posterior neck pain EXAM: CERVICAL SPINE - COMPLETE 4+ VIEW COMPARISON:  None. FINDINGS: Diffuse degenerative disc disease, advanced from C4-5 through C7-T1. Moderate bilateral degenerative facet disease. Uncovertebral spurring causes mild bilateral neural foraminal narrowing at C4-5 and C5-6. No fracture. Prevertebral soft tissues normal. Normal alignment. IMPRESSION: Degenerative disc and facet disease.  No  acute bony abnormality. Electronically Signed   By: Charlett Nose M.D.   On: 01/14/2021 13:25    Procedures Procedures   Medications Ordered in ED Medications  ketorolac (TORADOL) 15 MG/ML injection 30 mg (30 mg Intramuscular Given 01/14/21 1231)    ED Course  I have reviewed the triage vital signs and the nursing notes.  Pertinent labs & imaging results that were available during my care of the patient were reviewed by me and considered in my medical decision making (see chart for details).  Patient seen and examined.  IM Toradol ordered.  Patient's history and exam suggestive of musculoskeletal pain, possible component of cervical radiculopathy on the left side.  She is not weak today.  No concerning symptoms of central cord syndrome.  I do not feel that she requires emergent MRI today.  Will obtain plain films.  Patient is already been seen and treated with several modalities.  She may benefit from continued work-up for cervical radiculopathy as as outpatient.  Vital signs reviewed and are as follows: BP 130/82 (BP Location: Right Arm)   Pulse (!) 101   Temp 98.4 F (36.9 C) (Oral)   Resp 20   Ht 5' (1.524 m)   Wt 56.7 kg   SpO2 99%   BMI 24.41 kg/m   Cervical spine imaging suggestive of arthritis.  Patient updated on results.  She appears more comfortable after IM Toradol.  Plan 12-day taper prednisone.  We will give contact information for  neurosurgery in case she would like to follow-up with them.  I did try to discuss with patient, family medicine providers concern over her taking too much muscle relaxer.  Patient obviously not happy with care she received the other day and states that she will not be going back there.  Encouraged her to follow-up with neurosurgery, pain clinic, for further work-up of her neck and arm symptoms.    MDM Rules/Calculators/A&P                           Patient with ongoing neck pain, no chronic.  She has not received much improvement from anti-inflammatories, muscle relaxers.  Plain film imaging today with evidence of arthritis.  Radiation of pain into the arm with some paresthesias suggestive of cervical radiculopathy.  She will be trialed on a 12-day course of prednisone.  She will need follow-up.  She could potentially require MRI in order to better define possible cervical pathology.  No indication for emergent MRI or admission today.  No focal neurologic deficits or weakness.  No strokelike symptoms to suggest vascular dissection.   Final Clinical Impression(s) / ED Diagnoses Final diagnoses:  Neck pain  Cervical radiculopathy    Rx / DC Orders ED Discharge Orders          Ordered    predniSONE (DELTASONE) 20 MG tablet        01/14/21 1355             Renne Crigler, PA-C 01/14/21 1400    Koleen Distance, MD 01/14/21 [phone]

## 2021-01-14 NOTE — ED Triage Notes (Signed)
Pt arrives pov with c/o posterior neck pain, radiating to L shoulder and into arm and L ear. Pt reports treatment at [facility] x 2 visits, given muscle relaxer and cortisone injection with little relief. Pt Denies CP. Reports flexeril and meloxicam pta.

## 2021-01-14 NOTE — ED Notes (Signed)
Pt talking on phone; NAD; sts pain unchanged

## 2021-01-14 NOTE — Discharge Instructions (Signed)
Please read and follow all provided instructions.  Your diagnoses today include:  1. Cervical radiculopathy   2. Neck pain     Tests performed today include: Vital signs - see below for your results today X-ray - shows signs of arthritis in your neck  Medications prescribed:  Prednisone - steroid medicine   It is best to take this medication in the morning to prevent sleeping problems. If you are diabetic, monitor your blood sugar closely and stop taking Prednisone if blood sugar is over 300. Take with food to prevent stomach upset.   Take any prescribed medications only as directed.  Home care instructions:  Follow any educational materials contained in this packet Please rest, use ice or heat on your back for the next several days Do not lift, push, pull anything more than 10 pounds for the next week  Follow-up instructions: Please follow-up with your primary care provider and/or the neurosurgeon listed for further evaluation of your symptoms.   Return instructions:  SEEK IMMEDIATE MEDICAL ATTENTION IF YOU HAVE: New numbness, tingling, weakness, or problem with the use of your arms or legs Severe back pain not relieved with medications Loss control of your bowels or bladder Increasing pain in any areas of the body (such as chest or abdominal pain) Shortness of breath, dizziness, or fainting.  Worsening nausea (feeling sick to your stomach), vomiting, fever, or sweats Any other emergent concerns regarding your health   Additional Information:  Your vital signs today were: BP 117/81 (BP Location: Right Arm)   Pulse 74   Temp 98.4 F (36.9 C) (Oral)   Resp 18   Ht 5' (1.524 m)   Wt 56.7 kg   SpO2 100%   BMI 24.41 kg/m  If your blood pressure (BP) was elevated above 135/85 this visit, please have this repeated by your doctor within one month. --------------

## 2021-01-17 ENCOUNTER — Other Ambulatory Visit (HOSPITAL_BASED_OUTPATIENT_CLINIC_OR_DEPARTMENT_OTHER): Payer: Self-pay

## 2021-04-25 ENCOUNTER — Encounter: Payer: Self-pay | Admitting: Family Medicine

## 2021-04-25 ENCOUNTER — Ambulatory Visit (INDEPENDENT_AMBULATORY_CARE_PROVIDER_SITE_OTHER): Payer: Medicare Other | Admitting: Family Medicine

## 2021-04-25 VITALS — BP 122/90 | Ht 60.0 in | Wt 131.0 lb

## 2021-04-25 DIAGNOSIS — M5412 Radiculopathy, cervical region: Secondary | ICD-10-CM | POA: Diagnosis not present

## 2021-04-25 MED ORDER — MELOXICAM 7.5 MG PO TABS: 7.5000 mg | ORAL_TABLET | Freq: Two times a day (BID) | ORAL | 1 refills | Status: DC | PRN

## 2021-04-25 MED ORDER — HYDROCODONE-ACETAMINOPHEN 5-325 MG PO TABS: 1.0000 | ORAL_TABLET | Freq: Three times a day (TID) | ORAL | 0 refills | Status: DC | PRN

## 2021-04-25 NOTE — Assessment & Plan Note (Signed)
Acute on chronic in nature.  She has neurological changes on exam and has tried medications and home therapy with symptoms beginning in July. -Counseled on home exercise therapy and supportive care. - Norco. -Mobic. -MRI to evaluate for nerve impingement and for the consideration of an epidural

## 2021-04-25 NOTE — Patient Instructions (Signed)
Nice to meet you Please try heat on the area  Please use mobic. Do not take with ibuprofen  Please use the norco for severe pain.  [facility]   Please send me a message in MyChart with any questions or updates.  We will setup a virtual visit once the MRI is returned.   --Dr. Jordan Likes

## 2021-04-25 NOTE — Progress Notes (Signed)
  Tonya Chase - 50 y.o. female MRN [id number]  Date of birth: 1970-09-20  SUBJECTIVE:  Including CC & ROS.  No chief complaint on file.   Tonya Chase is a 50 y.o. female that is presenting with acute on chronic left-sided neck and arm pain.  Pain is been ongoing since July 11.  Has tried muscle relaxers and anti-inflammatories as well as intramuscular injections from her primary care.  No inciting event or trauma.  No history of surgery..  Independent review of the cervical spine x-ray from 7/31 shows degenerative changes of C4-5 and C5-6.   Review of Systems See HPI   HISTORY: Past Medical, Surgical, Social, and Family History Reviewed & Updated per EMR.   Pertinent Historical Findings include:  Past Medical History:  Diagnosis Date   Asthma    Eczema    Fibromyalgia    PTSD (post-traumatic stress disorder)     Past Surgical History:  Procedure Laterality Date   ABDOMINAL HYSTERECTOMY     CESAREAN SECTION     HERNIA REPAIR     WRIST SURGERY      History reviewed. No pertinent family history.  Social History   Socioeconomic History   Marital status: Single    Spouse name: Not on file   Number of children: Not on file   Years of education: Not on file   Highest education level: Not on file  Occupational History   Not on file  Tobacco Use   Smoking status: Former   Smokeless tobacco: Never  Vaping Use   Vaping Use: Every day  Substance and Sexual Activity   Alcohol use: Never   Drug use: Never   Sexual activity: Not on file  Other Topics Concern   Not on file  Social History Narrative   Not on file   Social Determinants of Health   Financial Resource Strain: Not on file  Food Insecurity: Not on file  Transportation Needs: Not on file  Physical Activity: Not on file  Stress: Not on file  Social Connections: Not on file  Intimate Partner Violence: Not on file     PHYSICAL EXAM:  VS: BP 122/90 (BP Location: Right Arm, Patient Position:  Sitting)   Ht 5' (1.524 m)   Wt 131 lb (59.4 kg)   BMI 25.58 kg/m  Physical Exam Gen: NAD, alert, cooperative with exam, well-appearing MSK:  Neck: Limited range of motion laterally to the left. Weakness with grip strength. Loss of biceps reflex. Limb weakness with resistance to wrist extension and flexion. Weakness with elbow flexion and extension. Positive Spurling's test.   ASSESSMENT & PLAN:   Cervical radiculopathy Acute on chronic in nature.  She has neurological changes on exam and has tried medications and home therapy with symptoms beginning in July. -Counseled on home exercise therapy and supportive care. - Norco. -Mobic. -MRI to evaluate for nerve impingement and for the consideration of an epidural

## 2021-04-28 ENCOUNTER — Ambulatory Visit (HOSPITAL_BASED_OUTPATIENT_CLINIC_OR_DEPARTMENT_OTHER)
Admission: RE | Admit: 2021-04-28 | Discharge: 2021-04-28 | Disposition: A | Discharge status: Home / Self Care | Payer: Medicare Other | Source: Ambulatory Visit | Attending: Family Medicine | Admitting: Family Medicine

## 2021-04-28 ENCOUNTER — Other Ambulatory Visit: Payer: Self-pay

## 2021-04-28 DIAGNOSIS — M5412 Radiculopathy, cervical region: Secondary | ICD-10-CM | POA: Diagnosis present

## 2021-05-01 ENCOUNTER — Encounter: Payer: Self-pay | Admitting: Family Medicine

## 2021-05-01 ENCOUNTER — Telehealth (INDEPENDENT_AMBULATORY_CARE_PROVIDER_SITE_OTHER): Payer: Medicare Other | Admitting: Family Medicine

## 2021-05-01 DIAGNOSIS — M5412 Radiculopathy, cervical region: Secondary | ICD-10-CM | POA: Diagnosis not present

## 2021-05-01 NOTE — Assessment & Plan Note (Signed)
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 -Counseled on home exercise therapy and supportive care. -Epidural

## 2021-05-01 NOTE — Progress Notes (Signed)
Virtual Visit via Video Note  I connected with Tonya Chase on 05/01/21 at  8:10 AM EST by a video enabled telemedicine application and verified that I am speaking with the correct person using two identifiers.  Location: Patient: home Provider: office   I discussed the limitations of evaluation and management by telemedicine and the availability of in person appointments. The patient expressed understanding and agreed to proceed.  History of Present Illness:  Tonya Chase is a 50 year old female that is following up after the MRI of her cervical spine.  She continues to have radicular pain down the left arm.  MRI was revealing for foraminal stenosis on the left side that is more moderate that is likely the source of her pain.  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 -Counseled on home exercise therapy and supportive care. -Epidural  Follow Up Instructions:    I discussed the assessment and treatment plan with the patient. The patient was provided an opportunity to ask questions and all were answered. The patient agreed with the plan and demonstrated an understanding of the instructions.   The patient was advised to call back or seek an in-person evaluation if the symptoms worsen or if the condition fails to improve as anticipated.    Clare Gandy, MD

## 2021-05-02 ENCOUNTER — Telehealth: Payer: Self-pay | Admitting: Family Medicine

## 2021-05-02 ENCOUNTER — Inpatient Hospital Stay
Admission: RE | Admit: 2021-05-02 | Discharge: 2021-05-02 | Disposition: A | Discharge status: Home / Self Care | Payer: Medicare Other | Source: Ambulatory Visit | Attending: Family Medicine | Admitting: Family Medicine

## 2021-05-02 NOTE — Telephone Encounter (Signed)
Pt cld states was unable to get  MRI  or Epidural @ GI due to them requiring her being accompanied by helper(per pt she has No one to go with her) she has been advised to contact her county [employer] for assistance since no family in [place] to assist her.--pt very dramatic regard issue w/ neck & arm pain .  ---Patient  ask if we can find a Provider in [place] that will perform the MRI & Epidural due to her predicament.  Per order below: (MRI to evaluate for nerve impingement and for the consideration of an epidural)  --forwarding request to med asst.   --glh

## 2021-05-02 NOTE — Discharge Instructions (Signed)

## 2021-05-03 NOTE — Telephone Encounter (Signed)
Left message for Muddy @ [facility] to call me back re: below.

## 2021-05-04 NOTE — Telephone Encounter (Signed)
[place] from [facility] called and left a message stating they do not offer transportation services for patients. Please advise.

## 2021-05-07 NOTE — Telephone Encounter (Signed)
Pt informed of below.  She states she will work on it, but she really has no one who can or is willing to ride with her. She states it will be easier to get a driver if her epidural is done in [place]. Is there anywhere in [facility] she can have the epidural done at?

## 2021-12-07 ENCOUNTER — Other Ambulatory Visit: Payer: Self-pay

## 2021-12-07 ENCOUNTER — Emergency Department (HOSPITAL_BASED_OUTPATIENT_CLINIC_OR_DEPARTMENT_OTHER)
Admission: EM | Admit: 2021-12-07 | Discharge: 2021-12-07 | Disposition: A | Discharge status: Home / Self Care | Payer: Medicare Other | Attending: Emergency Medicine | Admitting: Emergency Medicine

## 2021-12-07 ENCOUNTER — Encounter (HOSPITAL_BASED_OUTPATIENT_CLINIC_OR_DEPARTMENT_OTHER): Payer: Self-pay

## 2021-12-07 DIAGNOSIS — J45909 Unspecified asthma, uncomplicated: Secondary | ICD-10-CM | POA: Diagnosis not present

## 2021-12-07 DIAGNOSIS — Z91040 Latex allergy status: Secondary | ICD-10-CM | POA: Insufficient documentation

## 2021-12-07 DIAGNOSIS — Z7951 Long term (current) use of inhaled steroids: Secondary | ICD-10-CM | POA: Insufficient documentation

## 2021-12-07 DIAGNOSIS — R21 Rash and other nonspecific skin eruption: Secondary | ICD-10-CM | POA: Diagnosis not present

## 2021-12-07 MED ORDER — PREDNISONE 10 MG (21) PO TBPK: ORAL_TABLET | Freq: Every day | ORAL | 0 refills | Status: DC

## 2021-12-07 NOTE — ED Triage Notes (Signed)
Rash x 3 weeks to left leg and left arm and low back. Describes as itchy.

## 2021-12-07 NOTE — ED Provider Notes (Signed)
[facility] EMERGENCY DEPARTMENT Provider Note   CSN: [id number] Arrival date & time: 12/07/21  2054     History  Chief Complaint  Patient presents with   Rash    Tonya Chase is a 51 y.o. female asthma, fibromyalgia, eczema, PTSD.  Presents to the emergency department with a chief plaint of rash.  Patient reports that she developed a new rash to her left lower leg, left arm, and back over the last 3 weeks.  Patient endorses pruritus to rash.  Patient states that this rash appears different from her eczema that she does without a regular basis.  Patient reports that she has been using her prescribed triamcinolone cream on this rash with no improvement in symptoms.  Patient has also been taking hydroxyzine with no improvement in her pruritus.  Patient states that she has not been able to follow-up with her dermatologist but does have an appointment scheduled for September.  Patient denies any exposure to new personal hygiene products, cleaning products, pets or animals.  Patient denies any new medications.  Denies any fever, chills, purulent discharge, nausea, vomiting, diarrhea, trouble swallowing, trouble breathing. ,  IV drug use.   Rash Associated symptoms: no abdominal pain, no fever, no nausea, no shortness of breath and not vomiting        Home Medications Prior to Admission medications   Medication Sig Start Date End Date Taking? Authorizing Provider  diclofenac Sodium (VOLTAREN) 1 % GEL Apply 2 g topically 4 (four) times daily as needed (pain). 01/05/20   Long, Arlyss Repress, MD  eucerin-triamcinolone cream Apply 1 application on to the skin 2 (two) times daily. 10/20/20   Gwyneth Sprout, MD  HYDROcodone-acetaminophen (NORCO/VICODIN) 5-325 MG tablet Take 1 tablet by mouth every 8 (eight) hours as needed. 04/25/21   Myra Rude, MD  hydrOXYzine (ATARAX/VISTARIL) 25 MG tablet Take 1 tablet (25 mg total) by mouth 3 (three) times daily as needed. 10/20/20   Gwyneth Sprout, MD  meloxicam (MOBIC) 7.5 MG tablet Take 1 tablet (7.5 mg total) by mouth 2 (two) times daily as needed. 04/25/21   Myra Rude, MD  predniSONE (DELTASONE) 20 MG tablet 3 Tabs PO Days 1-3, then 2 tabs PO Days 4-6, then 1 tab PO Day 7-9, then Half Tab PO Day 10-12 01/14/21   Renne Crigler, PA-C      Allergies    Latex and Other    Review of Systems   Review of Systems  Constitutional:  Negative for chills and fever.  HENT:  Negative for facial swelling.   Respiratory:  Negative for shortness of breath.   Gastrointestinal:  Negative for abdominal pain, nausea and vomiting.  Musculoskeletal:  Negative for back pain and neck pain.  Skin:  Positive for rash. Negative for color change, pallor and wound.  Psychiatric/Behavioral:  Negative for confusion.     Physical Exam Updated Vital Signs BP 133/85 (BP Location: Left Arm)   Pulse 79   Temp 98.1 F (36.7 C) (Oral)   Resp 18   Ht 5' (1.524 m)   Wt 63.5 kg   SpO2 98%   BMI 27.34 kg/m  Physical Exam Vitals and nursing note reviewed.  Constitutional:      General: She is not in acute distress.    Appearance: She is not ill-appearing, toxic-appearing or diaphoretic.  HENT:     Head: Normocephalic.  Eyes:     General: No scleral icterus.       Right eye:  No discharge.        Left eye: No discharge.  Cardiovascular:     Rate and Rhythm: Normal rate.  Pulmonary:     Effort: Pulmonary effort is normal.  Skin:    General: Skin is warm and dry.     Findings: Petechiae and rash present. Rash is papular. Rash is not crusting, macular, nodular, purpuric, pustular, scaling, urticarial or vesicular.     Comments: Papular rash to left lower extremity, left antecubitus space, and left back as photographed below.  No rash to oral mucosa, palms, or soles of feet.  Neurological:     General: No focal deficit present.     Mental Status: She is alert.     GCS: GCS eye subscore is 4. GCS verbal subscore is 5. GCS motor subscore  is 6.  Psychiatric:        Behavior: Behavior is cooperative.           ED Results / Procedures / Treatments   Labs (all labs ordered are listed, but only abnormal results are displayed) Labs Reviewed - No data to display  EKG None  Radiology No results found.  Procedures Procedures    Medications Ordered in ED Medications - No data to display  ED Course/ Medical Decision Making/ A&P                           Medical Decision Making  Alert 51 year old female no acute distress, nontoxic-appearing.  Presents emerged department complaint of rash.  Information is obtained from patient.  I reviewed patient's past medical records including previous ER notes, labs, imaging.  Patient has medical history as outlined in HPI which complicates her care.  Patient has papular rash as described and photographed above.  No surrounding crusting, purulent discharge, erythema or warmth to suggest bacterial infection.  No petechia or purpura.  We will trial patient on steroid taper.  Patient to follow-up with PCP/dermatologist for repeat evaluation.  Patient care discussed with attending physician Dr. Jacqulyn Bath.  Based on patient's chief complaint, I considered admission might be necessary, however after reassuring ED workup feel patient is reasonable for discharge.  Discussed results, findings, treatment and follow up. Patient advised of return precautions. Patient verbalized understanding and agreed with plan.  Portions of this note were generated with Scientist, clinical (histocompatibility and immunogenetics). Dictation errors may occur despite best attempts at proofreading.         Final Clinical Impression(s) / ED Diagnoses Final diagnoses:  None    Rx / DC Orders ED Discharge Orders     None         Berneice Heinrich 12/07/21 2152    Maia Plan, MD 12/07/21 2315

## 2022-01-21 ENCOUNTER — Ambulatory Visit (HOSPITAL_COMMUNITY)
Admission: AD | Admit: 2022-01-21 | Discharge: 2022-01-21 | Disposition: A | Discharge status: Home / Self Care | Payer: Medicare Other | Attending: Psychiatry | Admitting: Psychiatry

## 2022-01-21 MED ORDER — SERTRALINE HCL 25 MG PO TABS: 25.0000 mg | ORAL_TABLET | Freq: Every day | ORAL | 1 refills | Status: AC
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
## 2022-01-22 ENCOUNTER — Encounter: Payer: Self-pay | Admitting: Nurse Practitioner

## 2022-01-26 ENCOUNTER — Encounter (HOSPITAL_BASED_OUTPATIENT_CLINIC_OR_DEPARTMENT_OTHER): Payer: Self-pay | Admitting: Emergency Medicine

## 2022-01-26 ENCOUNTER — Emergency Department (HOSPITAL_BASED_OUTPATIENT_CLINIC_OR_DEPARTMENT_OTHER)
Admission: EM | Admit: 2022-01-26 | Discharge: 2022-01-26 | Disposition: A | Discharge status: Home / Self Care | Payer: Medicare Other | Attending: Emergency Medicine | Admitting: Emergency Medicine

## 2022-01-26 ENCOUNTER — Other Ambulatory Visit: Payer: Self-pay

## 2022-01-26 DIAGNOSIS — Z91040 Latex allergy status: Secondary | ICD-10-CM | POA: Insufficient documentation

## 2022-01-26 DIAGNOSIS — R21 Rash and other nonspecific skin eruption: Secondary | ICD-10-CM | POA: Diagnosis present

## 2022-01-26 DIAGNOSIS — J45909 Unspecified asthma, uncomplicated: Secondary | ICD-10-CM | POA: Insufficient documentation

## 2022-01-26 MED ORDER — TRIAMCINOLONE ACETONIDE 0.1 % EX CREA: 1.0000 | TOPICAL_CREAM | Freq: Two times a day (BID) | CUTANEOUS | 1 refills | Status: AC

## 2022-01-26 NOTE — ED Provider Notes (Signed)
[facility] Provider Note   CSN: [id number] Arrival date & time: 01/26/22  1529     History Chief Complaint  Patient presents with   Rash    Tonya Chase is a 51 y.o. female with h/o asthma, PTSD, eczema presents the emergency department for evaluation of rash to her bilateral legs, bilateral arms, and back for the past few months.  She is followed up with a dermatologist for this and placed on topical doxepin, however she reports that its not helping.  She reports that the rash is itchy and has these hard nodules.  She denies any joint swelling, fevers, chest pain, shortness of breath, throat swelling.  Denies any new medications, detergents, lotions, or soaps.  She came here today because she reports that she is tired of the itching.   Rash      Home Medications Prior to Admission medications   Medication Sig Start Date End Date Taking? Authorizing Provider  diclofenac Sodium (VOLTAREN) 1 % GEL Apply 2 g topically 4 (four) times daily as needed (pain). 01/05/20   Long, Arlyss Repress, MD  eucerin-triamcinolone cream Apply 1 application on to the skin 2 (two) times daily. 10/20/20   Gwyneth Sprout, MD  hydrOXYzine (ATARAX/VISTARIL) 25 MG tablet Take 1 tablet (25 mg total) by mouth 3 (three) times daily as needed. 10/20/20   Gwyneth Sprout, MD  meloxicam (MOBIC) 7.5 MG tablet Take 1 tablet (7.5 mg total) by mouth 2 (two) times daily as needed. 04/25/21   Myra Rude, MD  sertraline (ZOLOFT) 25 MG tablet Take 1 tablet (25 mg total) by mouth daily. 01/21/22 01/21/23  Beverly Milch V, NP      Allergies    Fluoxetine hcl, Latex, and Other    Review of Systems   Review of Systems  Skin:  Positive for rash.    Physical Exam Updated Vital Signs BP 137/68 (BP Location: Right Arm)   Pulse 89   Temp 98.7 F (37.1 C) (Oral)   Resp 18   Ht 5' (1.524 m)   Wt 64 kg   SpO2 99%   BMI 27.54 kg/m  Physical Exam Vitals and nursing note reviewed.   Constitutional:      Appearance: Normal appearance.  Eyes:     General: No scleral icterus. Pulmonary:     Effort: Pulmonary effort is normal. No respiratory distress.  Skin:    General: Skin is dry.     Findings: Rash present.     Comments: Rash, excoriation marks, and lichenification noted to the patient's bilateral lower extremities, upper extremities, and back.  In comparison to the previous pictures, does appear similar in appearance.  There is no discharge from the wound or any blistering noted.  These are flat raised papules.  No overlying warmth or erythema noted to them.  Skin is dry.  No skin sloughing.  Neurological:     General: No focal deficit present.     Mental Status: She is alert. Mental status is at baseline.  Psychiatric:        Mood and Affect: Mood normal.              ED Results / Procedures / Treatments   Labs (all labs ordered are listed, but only abnormal results are displayed) Labs Reviewed - No data to display  EKG None  Radiology No results found.  Procedures Procedures   Medications Ordered in ED Medications - No data to display  ED Course/ Medical  Decision Making/ A&P                           Medical Decision Making Risk Prescription drug management.   51 year old female presents the emergency department for evaluation of continuous rash that she has had for several months.  Differential diagnosis includes was not limited to SJS, TENS, lichen planus, Kaposi's sarcoma, atopic dermatitis, psoriasis, contact dermatitis.  Vital signs are unremarkable.  Physical exam as noted above.  Please see images.  On chart evaluation, patient was seen here at the end of June 2023 for the same rash.  Since then, she is followed up with dermatology who put her on topical doxepin for lichen planus.  She reports that she has been on this medication for the past week but has not noted any relief in her symptoms.  She has not called her dermatologist  to let them know.  She is requesting a biopsy be done in the emergency department.  I informed the patient that we do not perform biopsies here in the emergency department and that she should call her dermatologist on Monday to schedule an appointment to let them know that the medication they gave her is not helping and that she would like a biopsy.  She is not having any joint pain or any fevers.  No blistering of the skin.  No skin sloughing.  Rashes not consistent with any SJS or TENS.  She denies any new sexual partners within the past few years.  She reports that the rash is itchy.  No new medications or detergents or soaps/lotions.  She denies any shortness of breath or any throat closing.  She is already tried topical triamcinolone, steroid burst pack, and the current doxepin she is on.  Discussed with my attending who does not recommend any pharmacological intervention and recommends that she follow-up with a dermatologist.  I discussed with the patient that she will ultimately need to follow-up with a dermatologist.  I suggested that she keep to the medication regimen that she was placed on by her dermatologist.  I offered to give her a refill of her hydroxyzine, however she declined and says she has plenty of them at home.  We discussed strict return precautions and red flag symptoms.  Patient verbalizes understanding and agrees to the plan.  Patient is stable and being discharged home in good condition.  I discussed this case with my attending physician who cosigned this note including patient's presenting symptoms, physical exam, and planned diagnostics and interventions. Attending physician stated agreement with plan or made changes to plan which were implemented.   Final Clinical Impression(s) / ED Diagnoses Final diagnoses:  Rash    Rx / DC Orders ED Discharge Orders     None         Achille Rich, PA-C 01/28/22 1317    Rolan Bucco, MD 01/28/22 1501

## 2022-01-26 NOTE — ED Notes (Signed)
Pt discharged to home. Discharge instructions have been discussed with patient and/or family members. Pt verbally acknowledges understanding d/c instructions, and endorses comprehension to checkout at registration before leaving.  °

## 2022-01-26 NOTE — ED Triage Notes (Signed)
Pt reports rashes all over are getting worse; she was seen here about 2 wks ago for same

## 2022-01-26 NOTE — Discharge Instructions (Signed)
You were seen in the ER for re-evaluation of your rash. I have given you a refill of the triamcinolone cream for you to apply to your body.  Please make sure you are applying this at night and wearing long sleeves, long pants, and socks.  Please make sure you are bathing and coolish water to keep your skin barrier intact. Please make sure to follow up with your Dermatologist on Monday for re-evaluation and possible biopsy. If you start having any joint pain, fevers, blisters, please follow up with your nearest ER.   Contact a doctor if: You sweat at night. You lose weight. You pee (urinate) more than normal. You pee less than normal, or you notice that your pee is a darker color than normal. You feel weak. You throw up (vomit). Your skin or the whites of your eyes look yellow (jaundice). Your skin: Tingles. Is numb. Your rash: Does not go away after a few days. Gets worse. You are: More thirsty than normal. More tired than normal. You have: New symptoms. Pain in your belly (abdomen). A fever. Watery poop (diarrhea). Get help right away if: You have a fever and your symptoms suddenly get worse. You start to feel mixed up (confused). You have a very bad headache or a stiff neck. You have very bad joint pains or stiffness. You have jerky movements that you cannot control (seizure). Your rash covers all or most of your body. The rash may or may not be painful. You have blisters that: Are on top of the rash. Grow larger. Grow together. Are painful. Are inside your nose or mouth. You have a rash that: Looks like purple pinprick-sized spots all over your body. Has a "bull's eye" or looks like a target. Is red and painful, causes your skin to peel, and is not from being in the sun too long.

## 2022-05-06 ENCOUNTER — Ambulatory Visit (HOSPITAL_COMMUNITY)
Admission: AD | Admit: 2022-05-06 | Discharge: 2022-05-06 | Disposition: A | Discharge status: Home / Self Care | Payer: Medicare Other | Attending: Psychiatry | Admitting: Psychiatry

## 2022-05-06 NOTE — H&P (Signed)
Behavioral Health Medical Screening Exam  HPI: Tonya Chase is a 51 y.o. African American female who presented voluntarily as a walk-in to [facility] with complaint of feeling sad, fatigue, generalized pain, depression, in the context of her skin disease-generalized lichen planus.  She has past psychiatric and medical history of PTSD, eczema, lichen planus, and asthma.  Patient reports that she feels like covering herself up all times, in the winter and in summer.  Reports she cannot have a reasonable relationship because of her skin problem.  Reports it started about 2 years ago, and she has been seeing a dermatologist without good outcome.  Report that her brother died last week, her father died one year ago, and her grandfather is in intensive care unit.  States, "I really wanted someone to talk to and I do not have any friends."  Patient lives at [facility] in her home.  Assessment: Patient is seen face-to-face and examined in the screen room.  She is calm and with teary eyes throughout the encounter.  Chart reviewed and findings shared with the treatment team and consult with Dr. Dwyane Dee.  Alert and oriented x4, speech clear and coherent.  Presents with anxious, depressed, and hopeless mood with congruent affect.  Reports she feels pain and sadness all around her.  Having poor relationship because of her skin.  Reports depressive symptoms and characterized as crying spells, irritability, hopelessness, worthlessness, poor concentration and anhedonia.  Reports taking mirtazapine, buspirone, risperidone for anxiety and depression prescribed by a psychiatrist at [facility] who manages her medications.` Reported anxiety and rated as 10/10 with 10 being the worst.  Encouraged to take her buspirone as ordered since that could help with anxiety.  Patient denies SI, HI, AVH.  She further denies delusional thoughts or paranoia.  She denies suicidal attempt or self injurious behavior.   Denies alcohol use, drug use, or any support system.  She endorses tobacco with menthol vaping about 8 times daily.  Instructions provided on cessation of tobacco products because of its adverse effects on overall medical wellbeing.  Patient nodded in agreement.  Disposition: Patient does not appear to be at imminent risk to self or others at present.  She does not meet the criteria for psychiatric inpatient admission.  Encouraged to reach out to [facility] for therapy and counseling services. Supportive therapy provided for ongoing stressors.  Discussed crisis plan, support from social network, calling 911, coming to the emergency department, and calling suicide hotline.  Total Time spent with patient: 30 minutes  Psychiatric Specialty Exam:  Presentation  General Appearance:  Appropriate for Environment; Casual; Fairly Groomed  Eye Contact: Good  Speech: Clear and Coherent  Speech Volume: Normal  Handedness: Right  Mood and Affect  Mood: Anxious; Depressed; Hopeless  Affect: Congruent  Thought Process  Thought Processes: Coherent  Descriptions of Associations:Intact  Orientation:Full (Time, Place and Person)  Thought Content:Logical  History of Schizophrenia/Schizoaffective disorder:No data recorded Duration of Psychotic Symptoms:No data recorded Hallucinations:Hallucinations: None  Ideas of Reference:None  Suicidal Thoughts:Suicidal Thoughts: No  Homicidal Thoughts:Homicidal Thoughts: No  Sensorium  Memory: Immediate Good; Recent Good; Remote Good  Judgment: Good  Insight: Good  Executive Functions  Concentration: Good  Attention Span: Good  Recall: Good  Fund of Knowledge: Good  Language: Good  Psychomotor Activity  Psychomotor Activity: Psychomotor Activity: Normal  Assets  Assets: Communication Skills; Desire for Improvement; Financial Resources/Insurance; Housing; Physical Health; Transportation  Sleep  Sleep: Sleep:  Good Number of Hours of  Sleep: 7  Physical Exam: Physical Exam Vitals and nursing note reviewed.  Constitutional:      Appearance: Normal appearance.  HENT:     Head: Normocephalic.     Right Ear: External ear normal.     Left Ear: External ear normal.     Nose: Nose normal.     Mouth/Throat:     Mouth: Mucous membranes are moist.     Pharynx: Oropharynx is clear.  Eyes:     Extraocular Movements: Extraocular movements intact.     Conjunctiva/sclera: Conjunctivae normal.     Pupils: Pupils are equal, round, and reactive to light.  Cardiovascular:     Rate and Rhythm: Normal rate.     Pulses: Normal pulses.  Pulmonary:     Effort: Pulmonary effort is normal.  Abdominal:     Palpations: Abdomen is soft.  Genitourinary:    Comments: Deferred Musculoskeletal:        General: Normal range of motion.     Cervical back: Normal range of motion.  Skin:    General: Skin is warm.  Neurological:     General: No focal deficit present.     Mental Status: She is alert and oriented to person, place, and time.    Review of Systems  Constitutional: Negative.  Negative for chills and fever.  HENT: Negative.  Negative for hearing loss and tinnitus.   Eyes: Negative.  Negative for blurred vision and double vision.  Respiratory: Negative.  Negative for cough, sputum production and shortness of breath.   Cardiovascular: Negative.  Negative for chest pain and palpitations.  Gastrointestinal: Negative.  Negative for abdominal pain, constipation, diarrhea, heartburn, nausea and vomiting.  Genitourinary: Negative.  Negative for dysuria, frequency and urgency.  Musculoskeletal: Negative.  Negative for myalgias.  Skin: Negative.  Negative for itching and rash.  Neurological: Negative.  Negative for dizziness, tingling and headaches.  Endo/Heme/Allergies: Negative.  Negative for environmental allergies and polydipsia. Does not bruise/bleed easily.               Reaction Severity Reaction  Type Noted       Allergies    Fluoxetine Hcl  Hives Not Specified Unspecified     Latex   Not Specified  01/05/2020    Other   Not Specified  01/05/2020 Tree nuts, raw fruit      Psychiatric/Behavioral:  The patient is nervous/anxious.    Blood pressure (!) 124/93, pulse 79, resp. rate 16. There is no height or weight on file to calculate BMI.  Musculoskeletal: Strength & Muscle Tone: within normal limits Gait & Station: normal Patient leans: N/A  [place] Scale:  Flowsheet Row OP Visit [facility] from 01/26/2022 in [facility] [place] [facility] OP Visit from 01/21/2022 [facility]  C-SSRS RISK CATEGORY No Risk No Risk No Risk       Recommendations:  Based on my evaluation the patient does not appear to have an emergency medical condition.  Cecilie Lowers, FNP 05/06/2022, 5:54 PM

## 2022-08-01 IMAGING — MR MR CERVICAL SPINE W/O CM
5 series · 31 of 48 positions shown · non-contrast
Comparison: Cervical spine radiographs 01/14/2021. Head CT
03/29/2020.

CLINICAL DATA: 50-year-old female with occipital pain, neck pain
radiating to the left shoulder and down the arm for 4 months. Right
hand tingling. No known injury.

EXAM:
MRI CERVICAL SPINE WITHOUT CONTRAST
TECHNIQUE: Multiplanar, multisequence MR imaging of the cervical spine was
performed. No intravenous contrast was administered.

[Series 2: T2 · sagittal · 3.0mm · 0.62mm/px · 6 of 14 slices shown (1 of 3)]
[im 1/14]
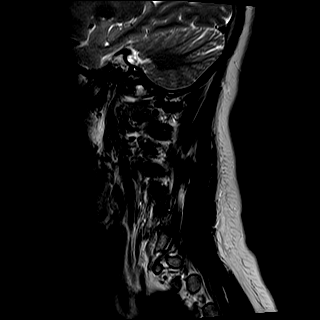
[im 3/14]
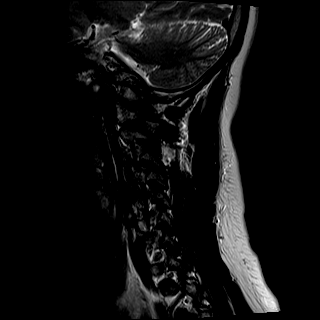
[im 6/14]
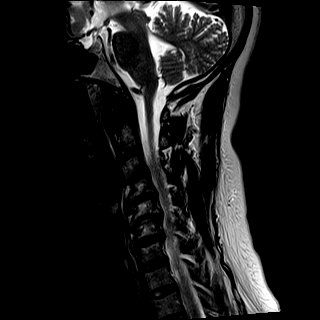
[im 8/14]
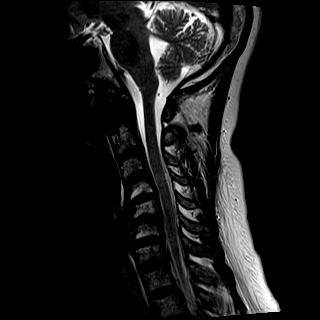
[im 11/14]
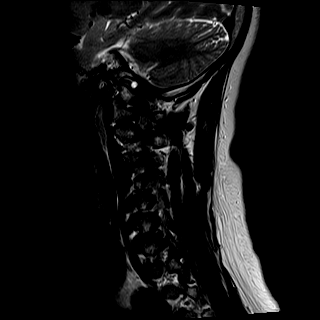
[im 14/14]
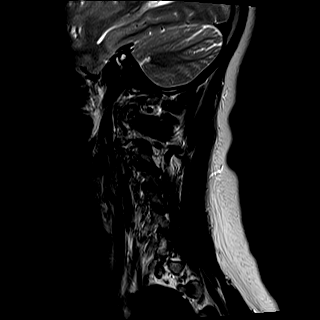

[Series 3: T1 · sagittal · 3.0mm · 0.62mm/px · 6 of 14 slices shown]
[im 1/14]
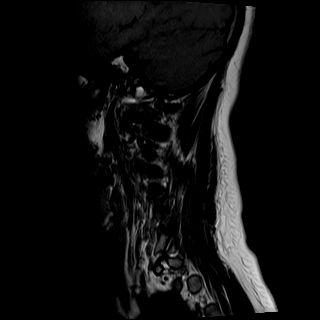
[im 3/14]
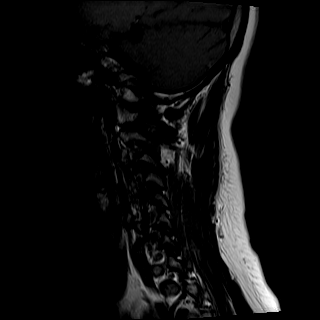
[im 6/14]
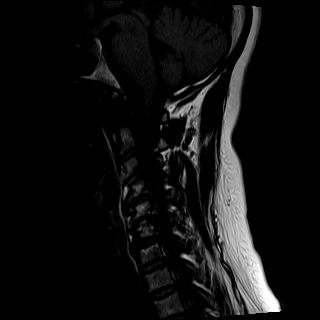
[im 8/14]
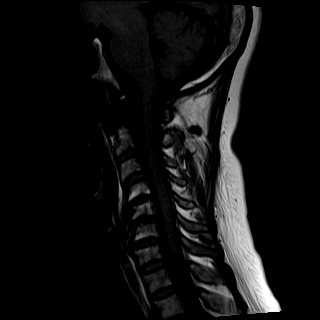
[im 11/14]
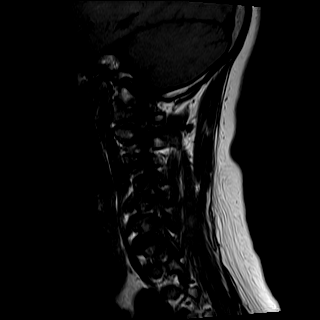
[im 14/14]
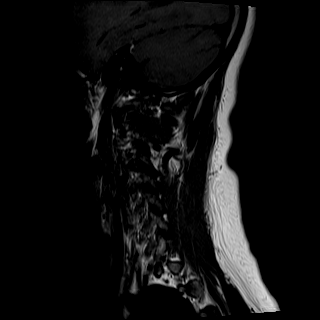

[Series 4: STIR · sagittal · 3.0mm · 0.62mm/px · 1 of 14 slices shown]
[im 1/14]
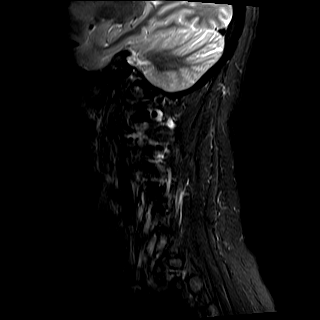

[Series 5: T2 · axial · 3.0mm · 0.56mm/px · z∈[-35,+86]mm · 9 of 36 slices shown (2 of 3)]
[im 1/36]
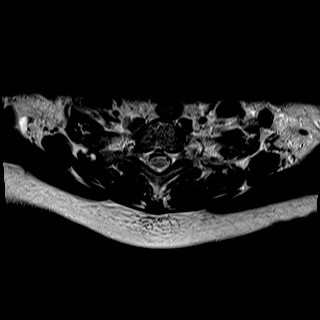
[im 6/36]
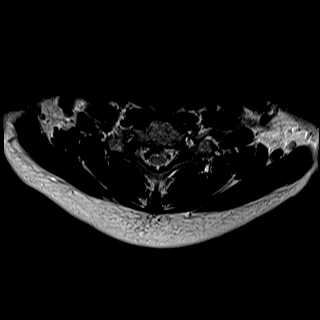
[im 11/36]
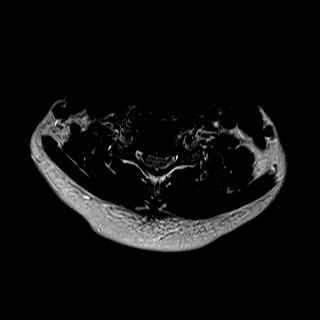
[im 16/36]
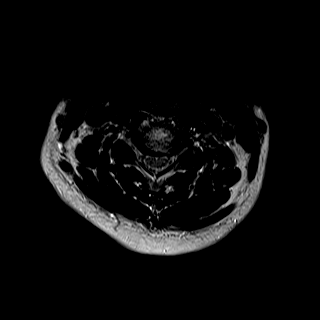
[im 18/36]
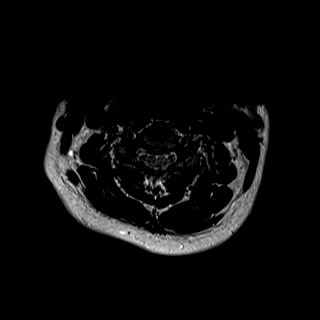
[im 21/36]
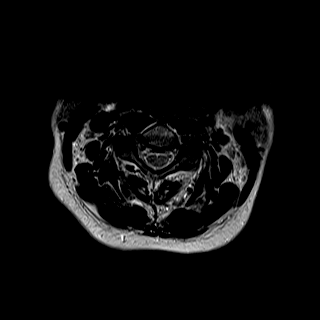
[im 26/36]
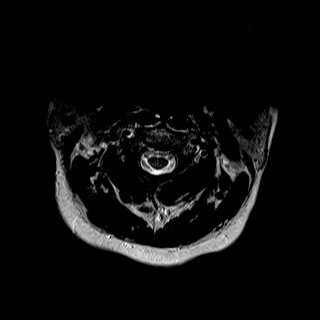
[im 31/36]
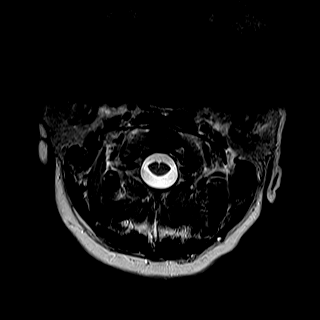
[im 36/36]
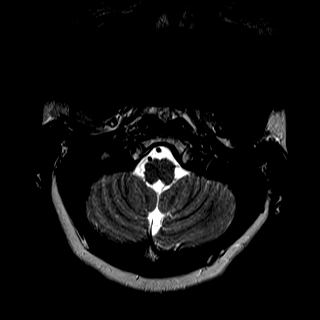

[Series 6: T2 · axial · 3.0mm · 0.38mm/px · z∈[-36,+84]mm · 9 of 36 slices shown (3 of 3)]
[im 1/36]
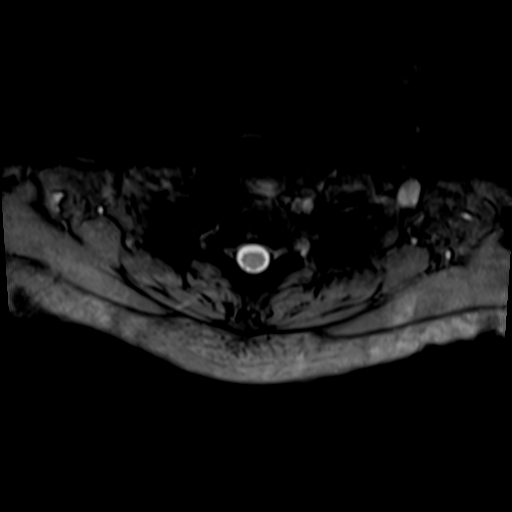
[im 6/36]
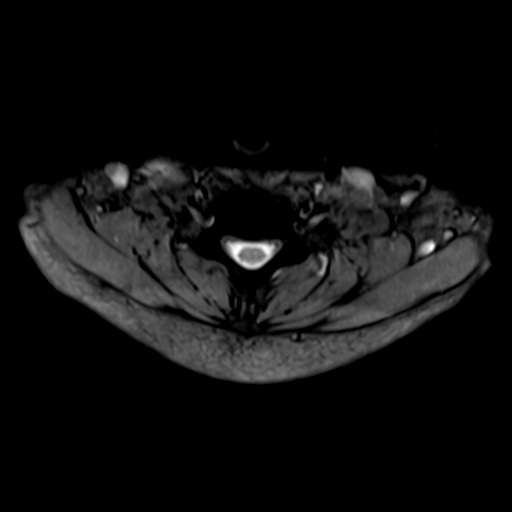
[im 11/36]
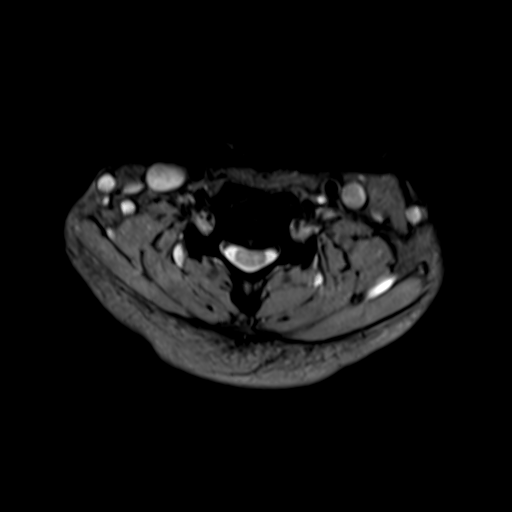
[im 16/36]
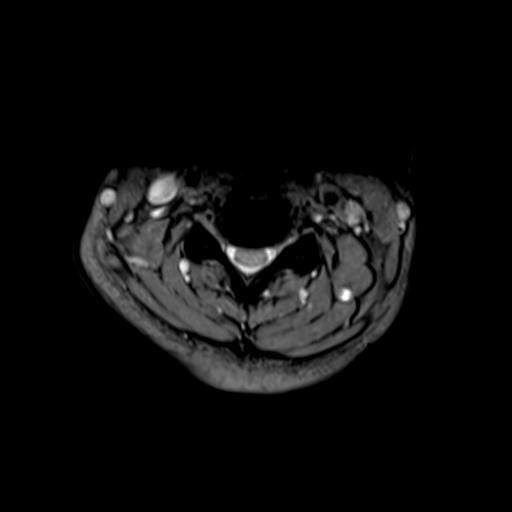
[im 18/36]
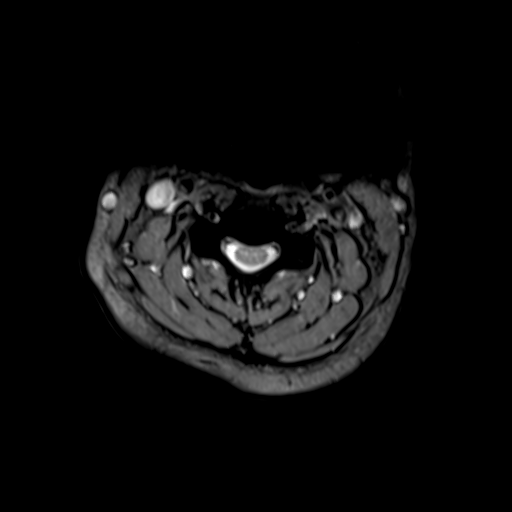
[im 21/36]
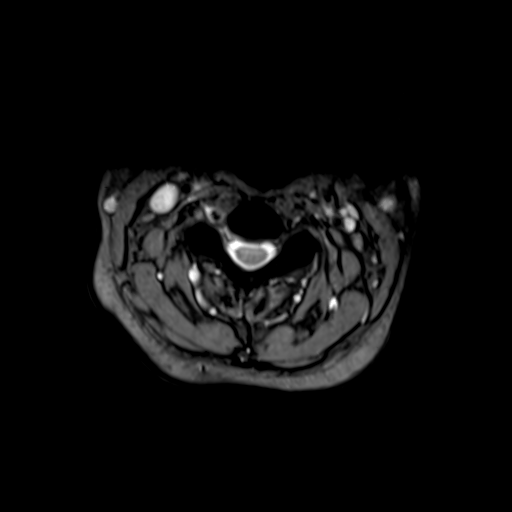
[im 26/36]
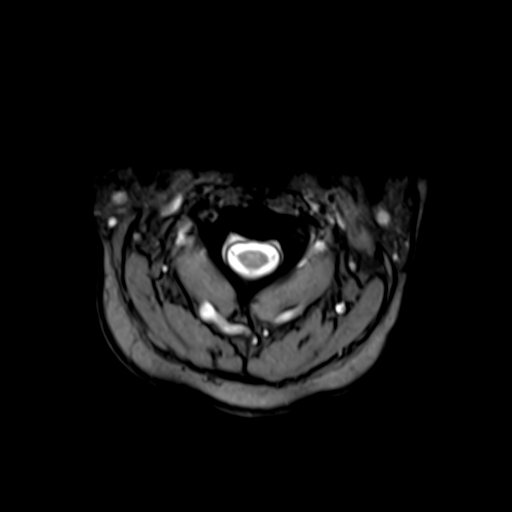
[im 31/36]
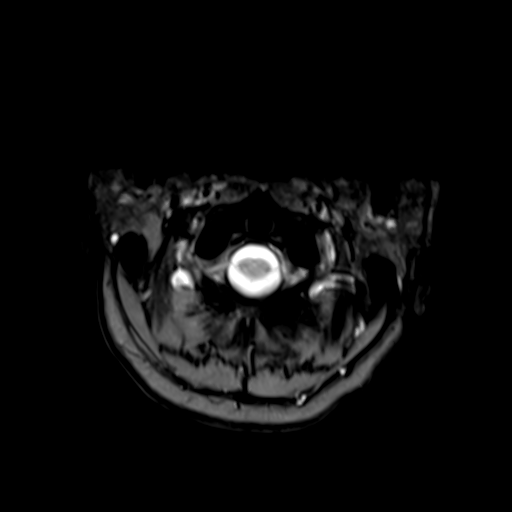
[im 36/36]
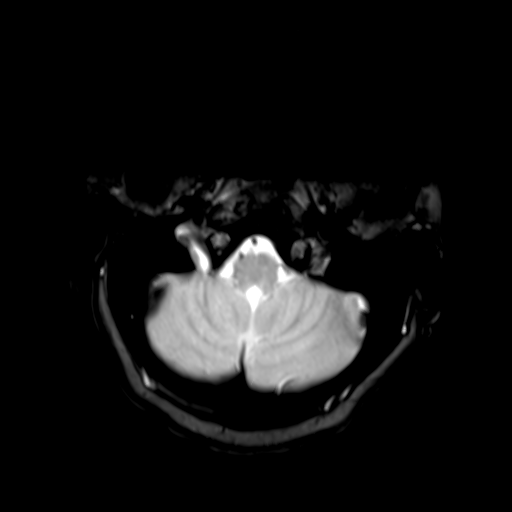

[31 of 48 positions shown; findings below may reference images not displayed]

FINDINGS: Alignment: Straightening and mild reversal of cervical lordosis,
increased compared to the [facility] radiographs. No significant
spondylolisthesis.

Vertebrae: Degenerative vertebral body and endplate marrow signal
changes C4 through C6. Normal background bone marrow signal. No
marrow edema or evidence of acute osseous abnormality.

Cord: No cervical cord signal abnormality despite some degenerative
cord mass effect detailed below.

Posterior Fossa, vertebral arteries, paraspinal tissues:
Cervicomedullary junction is within normal limits. Negative visible
posterior fossa. Preserved major vascular flow voids in the neck.
Negative visible neck soft tissues, lung apices.

Disc levels:

C2-C3: Subtle right paracentral disc protrusion and/or annular
fissure on series 5, image 13. No stenosis.

C3-C4: Mild to moderate facet hypertrophy greater on the left. No
spinal stenosis. Mild to moderate left C4 foraminal stenosis.

C4-C5: Mild disc space loss with anterior eccentric bulky disc
osteophyte complex. But bilateral foraminal involvement also.
Borderline spinal stenosis. Mild to moderate bilateral C5 neural
foraminal stenosis.

C5-C6: Mild disc space loss. Bulky circumferential disc osteophyte
complex. Broad-based posterior component with small annular fissure
in the midline (series 5, image 26). Mild spinal stenosis and spinal
cord mass effect. Moderate left, mild to moderate right C6 foraminal
stenosis.

C6-C7: Circumferential disc osteophyte complex with broad-based
posterior component. Mild ligament flavum hypertrophy. Borderline to
mild spinal stenosis. No cord mass effect. But moderate to severe
bilateral C7 foraminal stenosis, perhaps greater on the right.

C7-T1:  Subtle disc bulging. Mild facet hypertrophy. No stenosis.

No visible upper thoracic spinal stenosis.
IMPRESSION: 1. Advanced chronic disc and endplate degeneration C4-C5 through
C6-C7. Mild spinal stenosis with up to mild spinal cord mass effect
at C5-C6, but no spinal cord signal abnormality.

2. Associated moderate or severe neural foraminal stenosis at the
left C6 and bilateral C7 nerve levels. And up to moderate bilateral
C5 and C6 foraminal stenosis.

## 2022-08-13 ENCOUNTER — Ambulatory Visit: Payer: Medicare Other | Admitting: Family Medicine

## 2022-08-14 ENCOUNTER — Ambulatory Visit (INDEPENDENT_AMBULATORY_CARE_PROVIDER_SITE_OTHER): Payer: 59 | Admitting: Family Medicine

## 2022-08-14 ENCOUNTER — Other Ambulatory Visit (HOSPITAL_BASED_OUTPATIENT_CLINIC_OR_DEPARTMENT_OTHER): Payer: Self-pay

## 2022-08-14 ENCOUNTER — Encounter: Payer: Self-pay | Admitting: Family Medicine

## 2022-08-14 VITALS — BP 130/78 | Ht 60.0 in | Wt 165.0 lb

## 2022-08-14 DIAGNOSIS — M5412 Radiculopathy, cervical region: Secondary | ICD-10-CM | POA: Diagnosis not present

## 2022-08-14 MED ORDER — IBUPROFEN 600 MG PO TABS
600.0000 mg | ORAL_TABLET | Freq: Three times a day (TID) | ORAL | 1 refills | Status: AC | PRN
  Filled 2022-08-14: qty 60, 20d supply, fill #0

## 2022-08-14 NOTE — Assessment & Plan Note (Signed)
Acute on chronic in nature.  Continues to have left-sided radicular pain -Counseled on home exercise therapy and supportive care. -Epidural. -Ibuprofen. -Could consider physical therapy.

## 2022-08-14 NOTE — Patient Instructions (Signed)
Good to see you Please try heat  Please try the exercises  We have sent an order for an epidural   Please send me a message in MyChart with any questions or updates.  Please see me back in 3-4 weeks.   --Dr. Raeford Razor

## 2022-08-14 NOTE — Progress Notes (Signed)
  BRANY LINSEY - 52 y.o. female MRN [id number]  Date of birth: 09/04/1970  SUBJECTIVE:  Including CC & ROS.  No chief complaint on file.   Anajulia LAINA SYREK is a 52 y.o. female that is presenting with acute on neck left-sided radicular pain.  This has been occurring for the past few years.  She was unable to get the epidural.  She notices the pain from the neck down to her hand.  No recent injuries.  The pain is similar to what it was previously.  His MRI did confirm left-sided neural impingement.    Review of Systems See HPI   HISTORY: Past Medical, Surgical, Social, and Family History Reviewed & Updated per EMR.   Pertinent Historical Findings include:  Past Medical History:  Diagnosis Date   Asthma    Eczema    Fibromyalgia    PTSD (post-traumatic stress disorder)     Past Surgical History:  Procedure Laterality Date   ABDOMINAL HYSTERECTOMY     CESAREAN SECTION     HERNIA REPAIR     WRIST SURGERY       PHYSICAL EXAM:  VS: BP 130/78   Ht 5' (1.524 m)   Wt 165 lb (74.8 kg)   BMI 32.22 kg/m  Physical Exam Gen: NAD, alert, cooperative with exam, well-appearing MSK:  Neurovascularly intact       ASSESSMENT & PLAN:   Cervical radiculopathy Acute on chronic in nature.  Continues to have left-sided radicular pain -Counseled on home exercise therapy and supportive care. -Epidural. -Ibuprofen. -Could consider physical therapy.

## 2022-09-02 ENCOUNTER — Ambulatory Visit: Payer: 59 | Admitting: Family Medicine

## 2022-09-03 ENCOUNTER — Ambulatory Visit: Payer: 59 | Admitting: Family Medicine

## 2022-09-10 ENCOUNTER — Ambulatory Visit: Payer: 59 | Admitting: Family Medicine

## 2022-09-30 ENCOUNTER — Encounter: Payer: Self-pay | Admitting: *Deleted

## 2022-11-05 ENCOUNTER — Ambulatory Visit
Admission: RE | Admit: 2022-11-05 | Discharge: 2022-11-05 | Disposition: A | Discharge status: Home / Self Care | Payer: 59 | Source: Ambulatory Visit | Attending: Family Medicine | Admitting: Family Medicine

## 2022-11-05 DIAGNOSIS — M5412 Radiculopathy, cervical region: Secondary | ICD-10-CM

## 2022-11-05 MED ORDER — TRIAMCINOLONE ACETONIDE 40 MG/ML IJ SUSP (RADIOLOGY)
60.0000 mg | Freq: Once | INTRAMUSCULAR | Status: AC
  Administered 2022-11-05: 60 mg via EPIDURAL

## 2022-11-05 MED ORDER — IOPAMIDOL (ISOVUE-M 300) INJECTION 61%
1.0000 mL | Freq: Once | INTRAMUSCULAR | Status: AC | PRN
  Administered 2022-11-05: 1 mL via EPIDURAL

## 2022-11-05 NOTE — Discharge Instructions (Signed)

## 2022-12-03 ENCOUNTER — Other Ambulatory Visit: Payer: Self-pay | Admitting: *Deleted

## 2022-12-03 DIAGNOSIS — M5412 Radiculopathy, cervical region: Secondary | ICD-10-CM

## 2023-01-03 ENCOUNTER — Other Ambulatory Visit: Payer: Self-pay

## 2023-01-03 ENCOUNTER — Emergency Department (HOSPITAL_BASED_OUTPATIENT_CLINIC_OR_DEPARTMENT_OTHER)
Admission: EM | Admit: 2023-01-03 | Discharge: 2023-01-04 | Disposition: A | Discharge status: Home / Self Care | Payer: 59 | Attending: Emergency Medicine | Admitting: Emergency Medicine

## 2023-01-03 ENCOUNTER — Emergency Department (HOSPITAL_BASED_OUTPATIENT_CLINIC_OR_DEPARTMENT_OTHER): Payer: 59

## 2023-01-03 ENCOUNTER — Encounter (HOSPITAL_BASED_OUTPATIENT_CLINIC_OR_DEPARTMENT_OTHER): Payer: Self-pay

## 2023-01-03 DIAGNOSIS — Z87891 Personal history of nicotine dependence: Secondary | ICD-10-CM | POA: Diagnosis not present

## 2023-01-03 DIAGNOSIS — R6884 Jaw pain: Secondary | ICD-10-CM | POA: Insufficient documentation

## 2023-01-03 DIAGNOSIS — J45909 Unspecified asthma, uncomplicated: Secondary | ICD-10-CM | POA: Diagnosis not present

## 2023-01-03 DIAGNOSIS — Z91040 Latex allergy status: Secondary | ICD-10-CM | POA: Diagnosis not present

## 2023-01-03 LAB — CBC WITH DIFFERENTIAL/PLATELET
Abs Immature Granulocytes: 0.02 10*3/uL (ref 0.00–0.07)
Basophils Absolute: 0 10*3/uL (ref 0.0–0.1)
Basophils Relative: 0 %
Eosinophils Absolute: 0.2 10*3/uL (ref 0.0–0.5)
Eosinophils Relative: 2 %
HCT: 37.7 % (ref 36.0–46.0)
Hemoglobin: 12.3 g/dL (ref 12.0–15.0)
Immature Granulocytes: 0 %
Lymphocytes Relative: 27 %
Lymphs Abs: 2.3 10*3/uL (ref 0.7–4.0)
MCH: 24.7 pg — ABNORMAL LOW (ref 26.0–34.0)
MCHC: 32.6 g/dL (ref 30.0–36.0)
MCV: 75.9 fL — ABNORMAL LOW (ref 80.0–100.0)
Monocytes Absolute: 0.6 10*3/uL (ref 0.1–1.0)
Monocytes Relative: 7 %
Neutro Abs: 5.5 10*3/uL (ref 1.7–7.7)
Neutrophils Relative %: 64 %
Platelets: 307 10*3/uL (ref 150–400)
RBC: 4.97 MIL/uL (ref 3.87–5.11)
RDW: 15.1 % (ref 11.5–15.5)
WBC: 8.6 10*3/uL (ref 4.0–10.5)
nRBC: 0 % (ref 0.0–0.2)

## 2023-01-03 LAB — BASIC METABOLIC PANEL
Anion gap: 9 (ref 5–15)
BUN: 14 mg/dL (ref 6–20)
CO2: 21 mmol/L — ABNORMAL LOW (ref 22–32)
Calcium: 8.8 mg/dL — ABNORMAL LOW (ref 8.9–10.3)
Chloride: 105 mmol/L (ref 98–111)
Creatinine, Ser: 0.77 mg/dL (ref 0.44–1.00)
GFR, Estimated: >60 mL/min (ref >60)
Glucose, Bld: 102 mg/dL — ABNORMAL HIGH (ref 70–99)
Potassium: 4.2 mmol/L (ref 3.5–5.1)
Sodium: 135 mmol/L (ref 135–145)

## 2023-01-03 MED ORDER — IOHEXOL 300 MG/ML  SOLN
75.0000 mL | Freq: Once | INTRAMUSCULAR | Status: AC | PRN
  Administered 2023-01-03: 75 mL via INTRAVENOUS

## 2023-01-03 MED ORDER — KETOROLAC TROMETHAMINE 15 MG/ML IJ SOLN
15.0000 mg | Freq: Once | INTRAMUSCULAR | Status: AC
  Administered 2023-01-03: 15 mg via INTRAVENOUS
  Filled 2023-01-03: qty 1

## 2023-01-03 MED ORDER — DIAZEPAM 2 MG PO TABS
2.0000 mg | ORAL_TABLET | Freq: Once | ORAL | Status: AC
  Administered 2023-01-03: 2 mg via ORAL
  Filled 2023-01-03: qty 1

## 2023-01-03 NOTE — ED Provider Notes (Signed)
Care assumed from Dr. Suezanne Jacquet, patient with jaw pain, pending CT maxillofacial bones. Will need ENT referral.  CT scan is unremarkable.  I have independently viewed the images, and agree with the radiologist's interpretation.  I have examined the patient and find no obvious swelling.  She has normal WBC with normal differential.  I am referring her to ENT for follow-up, recommending she use warm compresses and over-the-counter NSAIDs.  Results for orders placed or performed during the hospital encounter of 01/03/23  CBC with Differential  Result Value Ref Range   WBC 8.6 4.0 - 10.5 K/uL   RBC 4.97 3.87 - 5.11 MIL/uL   Hemoglobin 12.3 12.0 - 15.0 g/dL   HCT 78.2 95.6 - 21.3 %   MCV 75.9 (L) 80.0 - 100.0 fL   MCH 24.7 (L) 26.0 - 34.0 pg   MCHC 32.6 30.0 - 36.0 g/dL   RDW 08.6 57.8 - 46.9 %   Platelets 307 150 - 400 K/uL   nRBC 0.0 0.0 - 0.2 %   Neutrophils Relative % 64 %   Neutro Abs 5.5 1.7 - 7.7 K/uL   Lymphocytes Relative 27 %   Lymphs Abs 2.3 0.7 - 4.0 K/uL   Monocytes Relative 7 %   Monocytes Absolute 0.6 0.1 - 1.0 K/uL   Eosinophils Relative 2 %   Eosinophils Absolute 0.2 0.0 - 0.5 K/uL   Basophils Relative 0 %   Basophils Absolute 0.0 0.0 - 0.1 K/uL   Immature Granulocytes 0 %   Abs Immature Granulocytes 0.02 0.00 - 0.07 K/uL  Basic metabolic panel  Result Value Ref Range   Sodium 135 135 - 145 mmol/L   Potassium 4.2 3.5 - 5.1 mmol/L   Chloride 105 98 - 111 mmol/L   CO2 21 (L) 22 - 32 mmol/L   Glucose, Bld 102 (H) 70 - 99 mg/dL   BUN 14 6 - 20 mg/dL   Creatinine, Ser 6.29 0.44 - 1.00 mg/dL   Calcium 8.8 (L) 8.9 - 10.3 mg/dL   GFR, Estimated >52 >84 mL/min   Anion gap 9 5 - 15   CT Maxillofacial W Contrast  Result Date: 01/04/2023 CLINICAL DATA:  Initial evaluation for jaw pain, trismus. EXAM: CT MAXILLOFACIAL WITH CONTRAST TECHNIQUE: Multidetector CT imaging of the maxillofacial structures was performed with intravenous contrast. Multiplanar CT image reconstructions  were also generated. RADIATION DOSE REDUCTION: This exam was performed according to the departmental dose-optimization program which includes automated exposure control, adjustment of the mA and/or kV according to patient size and/or use of iterative reconstruction technique. CONTRAST:  75mL OMNIPAQUE IOHEXOL 300 MG/ML  SOLN COMPARISON:  None Available. FINDINGS: Osseous: No acute osseous abnormality. No discrete or worrisome osseous lesions. Orbits: Globes and orbital soft tissues within normal limits. Sinuses: Paranasal sinuses are largely clear. Visualize mastoids and middle ear cavities are well pneumatized and free of fluid. Soft tissues: No appreciable soft tissue swelling or inflammatory changes seen about the soft tissues of the face. Salivary glands including the parotid and submandibular glands are within normal limits. Partially visualized thyroid normal. No adenopathy. Limited intracranial: Unremarkable. IMPRESSION: Negative maxillofacial CT. No acute inflammatory changes or other abnormality identified. Electronically Signed   By: Rise Mu M.D.   On: 01/04/2023 00:13      Dione Booze, MD 01/04/23 [phone]

## 2023-01-03 NOTE — ED Triage Notes (Signed)
Patient having right ear and jaw pain. She stated its hard to open her mouth.

## 2023-01-03 NOTE — ED Provider Notes (Signed)
[place] [facility] AT [facility] Provider Note  CSN: [id number] Arrival date & time: 01/03/23 1854  Chief Complaint(s) Jaw Pain and Otalgia  HPI Tonya Chase is a 52 y.o. female without significant past medical history presenting to the emergency department with right-sided jaw pain.  Reports that she is having the pain since today..  Reports trouble opening her mouth due to pain.  No dental pain.  No sore throat.  No fevers or chills.  Denies similar episode in the past.   Past Medical History Past Medical History:  Diagnosis Date   Asthma    Eczema    Fibromyalgia    PTSD (post-traumatic stress disorder)    Patient Active Problem List   Diagnosis Date Noted   Cervical radiculopathy 04/25/2021   Home Medication(s) Prior to Admission medications   Medication Sig Start Date End Date Taking? Authorizing Provider  hydrOXYzine (ATARAX/VISTARIL) 25 MG tablet Take 1 tablet (25 mg total) by mouth 3 (three) times daily as needed. 10/20/20   Gwyneth Sprout, MD  ibuprofen (ADVIL) 600 MG tablet Take 1 tablet (600 mg total) by mouth every 8 (eight) hours as needed. 08/14/22   Myra Rude, MD  sertraline (ZOLOFT) 25 MG tablet Take 1 tablet (25 mg total) by mouth daily. 01/21/22 01/21/23  Onuoha, Chinwendu V, NP  triamcinolone cream (KENALOG) 0.1 % Apply 1 Application topically 2 (two) times daily. 01/26/22   Achille Rich, PA-C                                                                                                                                    Past Surgical History Past Surgical History:  Procedure Laterality Date   ABDOMINAL HYSTERECTOMY     CESAREAN SECTION     HERNIA REPAIR     WRIST SURGERY     Family History History reviewed. No pertinent family history.  Social History Social History   Tobacco Use   Smoking status: Former   Smokeless tobacco: Never  Advertising account planner   Vaping status: Every Day  Substance Use Topics   Alcohol use: Never    Drug use: Never   Allergies Fluoxetine hcl, Latex, and Other  Review of Systems Review of Systems  All other systems reviewed and are negative.   Physical Exam Vital Signs  I have reviewed the triage vital signs BP 130/84   Pulse 73   Temp 98 F (36.7 C) (Oral)   Resp 18   Ht 5' (1.524 m)   Wt 74.8 kg   SpO2 100%   BMI 32.21 kg/m  Physical Exam Vitals and nursing note reviewed.  Constitutional:      General: She is not in acute distress.    Appearance: She is well-developed.  HENT:     Head: Normocephalic and atraumatic.     Comments: Mild trismus, tenderness over the right TMJ region.  Dentition normal, uvula midline, no  uvular deviation.    Mouth/Throat:     Mouth: Mucous membranes are moist.  Eyes:     Pupils: Pupils are equal, round, and reactive to light.  Cardiovascular:     Rate and Rhythm: Normal rate and regular rhythm.     Heart sounds: No murmur heard. Pulmonary:     Effort: Pulmonary effort is normal. No respiratory distress.     Breath sounds: Normal breath sounds.  Abdominal:     General: Abdomen is flat.     Palpations: Abdomen is soft.     Tenderness: There is no abdominal tenderness.  Musculoskeletal:        General: No tenderness.     Right lower leg: No edema.     Left lower leg: No edema.  Skin:    General: Skin is warm and dry.  Neurological:     General: No focal deficit present.     Mental Status: She is alert. Mental status is at baseline.  Psychiatric:        Mood and Affect: Mood normal.        Behavior: Behavior normal.     ED Results and Treatments Labs (all labs ordered are listed, but only abnormal results are displayed) Labs Reviewed  CBC WITH DIFFERENTIAL/PLATELET - Abnormal; Notable for the following components:      Result Value   MCV 75.9 (*)    MCH 24.7 (*)    All other components within normal limits  BASIC METABOLIC PANEL                                                                                                                           Radiology No results found.  Pertinent labs & imaging results that were available during my care of the patient were reviewed by me and considered in my medical decision making (see MDM for details).  Medications Ordered in ED Medications  ketorolac (TORADOL) 15 MG/ML injection 15 mg (15 mg Intravenous Given 01/03/23 2210)  diazepam (VALIUM) tablet 2 mg (2 mg Oral Given 01/03/23 2210)                                                                                                                                     Procedures Procedures  (including critical care time)  Medical Decision Making / ED Course   MDM:  52 year old female presenting to the  emergency department with jaw pain and trismus.  On exam, patient has mild trismus.  Has tenderness over the area.  No obvious signs of infection.  Suspect likely TMJ dysfunction.  Does not appear to be dislocated.  No trauma to suggest fracture.  No clear signs of infection but given trismus will obtain CT scan to further evaluate.  Will give Toradol, Valium as patient appears mildly anxious and could be component of muscle spasm.      Additional history obtained: -Additional history obtained from {wsadditionalhistorian:28072} -External records from outside source obtained and reviewed including: Chart review including previous notes, labs, imaging, consultation notes including ***   Lab Tests: -I ordered, reviewed, and interpreted labs.   The pertinent results include:   Labs Reviewed  CBC WITH DIFFERENTIAL/PLATELET - Abnormal; Notable for the following components:      Result Value   MCV 75.9 (*)    MCH 24.7 (*)    All other components within normal limits  BASIC METABOLIC PANEL    Notable for ***  EKG   EKG Interpretation Date/Time:    Ventricular Rate:    PR Interval:    QRS Duration:    QT Interval:    QTC Calculation:   R Axis:      Text Interpretation:           Imaging  Studies ordered: I ordered imaging studies including *** On my interpretation imaging demonstrates *** I independently visualized and interpreted imaging. I agree with the radiologist interpretation   Medicines ordered and prescription drug management: Meds ordered this encounter  Medications   ketorolac (TORADOL) 15 MG/ML injection 15 mg   diazepam (VALIUM) tablet 2 mg    -I have reviewed the patients home medicines and have made adjustments as needed   Consultations Obtained: I requested consultation with the ***,  and discussed lab and imaging findings as well as pertinent plan - they recommend: ***   Cardiac Monitoring: The patient was maintained on a cardiac monitor.  I personally viewed and interpreted the cardiac monitored which showed an underlying rhythm of: ***  Social Determinants of Health:  Diagnosis or treatment significantly limited by social determinants of health: {wssoc:28071}   Reevaluation: After the interventions noted above, I reevaluated the patient and found that their symptoms have {resolved/improved/worsened:23923::"improved"}  Co morbidities that complicate the patient evaluation  Past Medical History:  Diagnosis Date   Asthma    Eczema    Fibromyalgia    PTSD (post-traumatic stress disorder)       Dispostion: Disposition decision including need for hospitalization was considered, and patient {wsdispo:28070::"discharged from emergency department."}    Final Clinical Impression(s) / ED Diagnoses Final diagnoses:  None     This chart was dictated using voice recognition software.  Despite best efforts to proofread,  errors can occur which can change the documentation meaning.

## 2023-01-04 NOTE — Discharge Instructions (Addendum)
Use warm compresses several times a day.  Take over the counter anti-inflammatory medications such as ibuprofen or naproxen.  Please follow-up with the ENT physician for further evaluation.

## 2023-01-28 ENCOUNTER — Ambulatory Visit: Payer: 59 | Admitting: Sports Medicine

## 2023-01-28 VITALS — BP 120/76 | Ht 60.0 in | Wt 147.0 lb

## 2023-01-28 DIAGNOSIS — M5412 Radiculopathy, cervical region: Secondary | ICD-10-CM | POA: Diagnosis not present

## 2023-01-28 DIAGNOSIS — M5030 Other cervical disc degeneration, unspecified cervical region: Secondary | ICD-10-CM | POA: Diagnosis not present

## 2023-01-28 MED ORDER — PREDNISONE 10 MG PO TABS: ORAL_TABLET | ORAL | 0 refills | Status: DC

## 2023-01-28 NOTE — Progress Notes (Signed)
   Subjective:    Patient ID: Tonya Chase, female    DOB: 06-01-1971, 52 y.o.   MRN: [id number]  HPI chief complaint: Neck pain  Patient presents today complaining of longstanding neck pain that has recently become quite severe.  It was severe enough that she was seen in the emergency room a couple of weeks ago.  An MRI of her cervical spine done on August 13 of this year showed advanced chronic disc and endplate degeneration of C4-C5 through C6-C7.  She also has associated severe neuroforaminal stenosis at C6 and C7.  Pain will radiate up into her neck.  She does not really endorse any radicular pain in her arms.  No recent trauma.  Past medical history reviewed Medications reviewed Allergies reviewed    Review of Systems As above    Objective:   Physical Exam  Well-developed, well-nourished.  No acute distress  Cervical spine: Limited cervical range of motion in all planes.  Obvious muscle spasm.  Good strength and equal reflexes in both upper extremities.  No atrophy.  MRI findings as above      Assessment & Plan:   Chronic neck pain secondary to advanced degenerative disc disease and foraminal stenosis  Given the chronicity of her symptoms and her level of pain, I recommended consultation with Dr[facility]ell [facility].  In the meantime, we will try 6-day Sterapred Dosepak.  I will defer further workup and treatment to the discretion of Dr. Christell Constant.  She will follow-up with me as needed.  This note was dictated using Dragon naturally speaking software and may contain errors in syntax, spelling, or content which have not been identified prior to signing this note.

## 2023-02-13 ENCOUNTER — Encounter (HOSPITAL_BASED_OUTPATIENT_CLINIC_OR_DEPARTMENT_OTHER): Payer: Self-pay

## 2023-02-13 ENCOUNTER — Other Ambulatory Visit: Payer: Self-pay

## 2023-02-13 DIAGNOSIS — M25512 Pain in left shoulder: Secondary | ICD-10-CM | POA: Insufficient documentation

## 2023-02-13 DIAGNOSIS — Z5321 Procedure and treatment not carried out due to patient leaving prior to being seen by health care provider: Secondary | ICD-10-CM | POA: Diagnosis not present

## 2023-02-13 NOTE — ED Triage Notes (Signed)
Pt states she has 2 problems She has a frozen shoulder on the left, was set up with epidural shots every 60 days and now MD has moved and she has to start all over with new MD.    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

## 2023-02-14 ENCOUNTER — Emergency Department (HOSPITAL_BASED_OUTPATIENT_CLINIC_OR_DEPARTMENT_OTHER)
Admission: EM | Admit: 2023-02-14 | Discharge: 2023-02-14 | Discharge status: Against Medical Advice | Payer: 59 | Source: Home / Self Care

## 2023-02-14 ENCOUNTER — Ambulatory Visit: Payer: 59 | Admitting: Physician Assistant

## 2023-02-14 NOTE — ED Notes (Signed)
Called pt no answer x1 

## 2023-02-19 ENCOUNTER — Ambulatory Visit: Payer: 59 | Admitting: Orthopedic Surgery

## 2023-02-26 ENCOUNTER — Ambulatory Visit: Payer: 59 | Admitting: Orthopedic Surgery

## 2023-03-13 ENCOUNTER — Ambulatory Visit: Payer: 59 | Admitting: Orthopedic Surgery

## 2023-03-13 ENCOUNTER — Other Ambulatory Visit (INDEPENDENT_AMBULATORY_CARE_PROVIDER_SITE_OTHER): Payer: 59

## 2023-03-13 VITALS — BP 133/73 | HR 87 | Ht 60.0 in | Wt 147.6 lb

## 2023-03-13 DIAGNOSIS — M542 Cervicalgia: Secondary | ICD-10-CM

## 2023-03-13 DIAGNOSIS — M5412 Radiculopathy, cervical region: Secondary | ICD-10-CM

## 2023-03-13 MED ORDER — METHYLPREDNISOLONE 4 MG PO TBPK: ORAL_TABLET | ORAL | 0 refills | Status: AC

## 2023-03-13 NOTE — Progress Notes (Signed)
Orthopedic Spine Surgery Office Note  Assessment: Patient is a 52 y.o. female with neck pain that radiates into bilateral shoulders.  Has foraminal stenosis at C4/5, C5/6, C6/7   Plan: -Explained that initially conservative treatment is tried as a significant number of patients may experience relief with these treatment modalities. Discussed that the conservative treatments include:  -activity modification  -physical therapy  -over the counter pain medications  -medrol dosepak  -cervical steroid injections -Patient has tried gabapentin, Lyrica, narcotics -Patient asked for narcotic pain medications.  I explained that I will use those to control pain postoperatively.  Provide her with a referral to pain management and prescribed her a Medrol Dosepak -Recommended diagnostic/therapeutic cervical ESI -I explained to her that some of her pain is not coming from the neck.  She explains pain radiating to her ears and jaw and said has difficulty opening her mouth at time.  I told her the symptoms are not consistent with cervical radiculopathy -Would need to be nicotine free prior to any elective spine surgery -Patient should return to office in 6 weeks, x-rays at next visit: none   Patient expressed understanding of the plan and all questions were answered to the patient's satisfaction.   ___________________________________________________________________________   History:  Patient is a 52 y.o. female who presents today for cervical spine.  Patient has had several years of neck pain.  She said it has gotten acutely worse within the last 5 weeks.  She has been to the ER and urgent care since it started getting worse.  She feels the pain starts in her neck and goes into the bilateral trapezius and lateral aspect of the shoulders.  It does not radiate past the shoulder on either side.  There is no trauma or injury that preceded the onset of the acute worsening.  She rates the pain as a 9 out of 10.   She said sometimes she will cry out at work because of pain.  She has difficulty sleeping as result of the pain.  She says that the pain will also radiate to her ears and jaw.  She said when it gets bad like this she will also have difficulty opening her mouth.   Weakness: Denies Difficulty with fine motor skills (e.g., buttoning shirts, handwriting): Denies Symptoms of imbalance: Denies Paresthesias and numbness: Denies Bowel or bladder incontinence: Denies Saddle anesthesia: Denies  Treatments tried: Gabapentin, Lyrica, narcotics  Review of systems: Denies fevers and chills, night sweats, unexplained weight loss, history of cancer, pain that wakes them at night  Past medical history: Fibromyalgia PTSD COPD HTN  Allergies: latex, fluoxetine  Past surgical history:  Hernia repair C section Wrist surgery Hysterectomy  Social history: Reports use of nicotine product (smoking, vaping, patches, smokeless) Alcohol use: Denies Denies recreational drug use   Physical Exam:  BMI of 28.8  General: no acute distress, appears stated age Neurologic: alert, answering questions appropriately, following commands Respiratory: unlabored breathing on room air, symmetric chest rise Psychiatric: appropriate affect, normal cadence to speech   MSK (spine):  -Strength exam      Left  Right Grip strength                5/5  5/5 Interosseus   5/5   5/5 Wrist extension  5/5  5/5 Wrist flexion   5/5  5/5 Elbow flexion   5/5  5/5 Deltoid    5/5  5/5  -Sensory exam   Sensation intact to light touch in C5-T1 nerve distributions  of bilateral upper extremities  -Brachioradialis DTR: 2/4 on the left, 2/4 on the right -Biceps DTR: 2/4 on the left, 2/4 on the right  -Spurling: Negative bilaterally -Hoffman sign: Negative bilaterally -Clonus: No beats bilaterally -Interosseous wasting: None seen -Grip and release test: Negative -Gait: Normal  Left shoulder exam: TTP over the  trapezius, negative Jobe, negative belly press, no weakness with external rotation with arm at side, no pain through range of motion Right shoulder exam: TTP over the trapezius, negative Jobe, negative belly press, no weakness with external rotation with arm at side, no pain through range of motion  Tinel's at wrist: Negative bilaterally Phalen's at wrist: Negative bilaterally Durkan's: Negative bilaterally  Tinel's at elbow: Negative bilaterally  Imaging: XRs of the cervical spine from 03/13/2023 were independently reviewed and interpreted, showing disc height loss at C4/5, C5/6, C6/7. Neutral alignment. No fracture or dislocation seen. No evidence of instability on flexion/extension views.   MRI of the cervical spine from 04/28/2021 was independently reviewed and interpreted, showing bilateral foraminal stenosis (L>R) at C4/5 and C5/6. Bilateral foraminal stenosis at C6/7. No significant central stenosis. No T2 cord signal change.    Patient name: Tonya Chase Patient MRN: [id number] Date of visit: 03/13/23

## 2023-03-14 ENCOUNTER — Telehealth: Payer: Self-pay | Admitting: Orthopedic Surgery

## 2023-03-14 NOTE — Telephone Encounter (Signed)
I called and advised her that her meds were sent in yesterday to the pharmacy.

## 2023-03-14 NOTE — Telephone Encounter (Signed)
Patient called and said she is waiting on [facility] to send the medication. She is in pain. CB#[phone]

## 2023-03-17 ENCOUNTER — Telehealth: Payer: Self-pay | Admitting: Physical Medicine and Rehabilitation

## 2023-03-17 NOTE — Telephone Encounter (Signed)
Please call pt to set an appt if injection has been approved. Pt phone number [phone].

## 2023-03-18 ENCOUNTER — Telehealth: Payer: Self-pay | Admitting: Physical Medicine and Rehabilitation

## 2023-03-18 NOTE — Telephone Encounter (Signed)
Attempted to call patient and phone hung up

## 2023-03-18 NOTE — Telephone Encounter (Signed)
Patient called and wants to make an appointment for the Epidural shot. CB#[phone]

## 2023-03-19 ENCOUNTER — Telehealth: Payer: Self-pay | Admitting: Physical Medicine and Rehabilitation

## 2023-03-19 NOTE — Telephone Encounter (Signed)
Attempted to call patient, unable to leave voicemail.

## 2023-03-19 NOTE — Telephone Encounter (Signed)
Spoke with patient and scheduled injection for 03/26/23. Patient aware driver needed

## 2023-03-19 NOTE — Telephone Encounter (Signed)
Patient returned call asked for a call back to schedule an appointment with Dr. Alvester Morin.   The number to contact patient is [phone]

## 2023-03-26 ENCOUNTER — Other Ambulatory Visit: Payer: Self-pay

## 2023-03-26 ENCOUNTER — Ambulatory Visit: Payer: 59 | Admitting: Physical Medicine and Rehabilitation

## 2023-03-26 VITALS — BP 146/82 | HR 67

## 2023-03-26 DIAGNOSIS — M5412 Radiculopathy, cervical region: Secondary | ICD-10-CM | POA: Diagnosis not present

## 2023-03-26 MED ORDER — METHYLPREDNISOLONE ACETATE 40 MG/ML IJ SUSP
40.0000 mg | Freq: Once | INTRAMUSCULAR | Status: AC
Start: 2023-03-26 — End: 2023-03-26
  Administered 2023-03-26: 40 mg

## 2023-03-26 NOTE — Patient Instructions (Signed)

## 2023-03-26 NOTE — Progress Notes (Signed)
Functional Pain Scale - descriptive words and definitions  Distressing (6)    Pain is present/unable to complete most ADLs limited by pain/sleep is difficult and active distraction is only marginal. Moderate range order  Average Pain 7   +Driver, -BT, -Dye Allergies.  Neck pain on both sides that radiates up to the ears. Right jaw locked up last month

## 2023-04-02 ENCOUNTER — Telehealth: Payer: Self-pay | Admitting: Physical Medicine and Rehabilitation

## 2023-04-02 MED ORDER — METHOCARBAMOL 500 MG PO TABS: 500.0000 mg | ORAL_TABLET | Freq: Three times a day (TID) | ORAL | 0 refills | Status: AC | PRN

## 2023-04-02 NOTE — Telephone Encounter (Signed)
Patient called. Says she is still in pain. Need something else for pain. Her cb# is [phone]

## 2023-04-02 NOTE — Telephone Encounter (Signed)
I sent in Robaxin, she will need follow up with Dr. Christell Constant, if you can facilitate that.

## 2023-04-02 NOTE — Addendum Note (Signed)
Addended by: Ashok Norris on: 04/02/2023 04:28 PM   Modules accepted: Orders

## 2023-04-03 ENCOUNTER — Encounter: Payer: Self-pay | Admitting: Radiology

## 2023-04-03 ENCOUNTER — Telehealth: Payer: Self-pay | Admitting: Radiology

## 2023-04-07 NOTE — Procedures (Signed)
Cervical Epidural Steroid Injection - Interlaminar Approach with Fluoroscopic Guidance  Patient: Tonya Chase      Date of Birth: 1971/03/07 MRN: [id number] PCP: Riki Rusk, DO      Visit Date: 03/26/2023   Universal Protocol:    Date/Time: 10/21/247:55 PM  Consent Given By: the patient  Position: PRONE  Additional Comments: Vital signs were monitored before and after the procedure. Patient was prepped and draped in the usual sterile fashion. The correct patient, procedure, and site was verified.   Injection Procedure Details:   Procedure diagnoses: Radiculopathy, cervical region [M54.12]    Meds Administered:  Meds ordered this encounter  Medications   methylPREDNISolone acetate (DEPO-MEDROL) injection 40 mg     Laterality: Left  Location/Site: C7-T1  Needle: 3.5 in., 20 ga. Tuohy  Needle Placement: Paramedian epidural space  Findings:  -Comments: Excellent flow of contrast into the epidural space.  Procedure Details: Using a paramedian approach from the side mentioned above, the region overlying the inferior lamina was localized under fluoroscopic visualization and the soft tissues overlying this structure were infiltrated with 4 ml. of 1% Lidocaine without Epinephrine. A # 20 gauge, Tuohy needle was inserted into the epidural space using a paramedian approach.  The epidural space was localized using loss of resistance along with contralateral oblique bi-planar fluoroscopic views.  After negative aspirate for air, blood, and CSF, a 2 ml. volume of Isovue-250 was injected into the epidural space and the flow of contrast was observed. Radiographs were obtained for documentation purposes.   The injectate was administered into the level noted above.  Additional Comments:  No complications occurred Dressing: 2 x 2 sterile gauze and Band-Aid    Post-procedure details: Patient was observed during the procedure. Post-procedure instructions were  reviewed.  Patient left the clinic in stable condition.

## 2023-04-07 NOTE — Progress Notes (Signed)
Tonya Chase - 52 y.o. female MRN [id number]  Date of birth: Jun 05, 1971  Office Visit Note: Visit Date: 03/26/2023 PCP: Riki Rusk, DO Referred by: London Sheer, MD  Subjective: Chief Complaint  Patient presents with   Neck - Pain   HPI:  Tonya Chase is a 52 y.o. female who comes in today at the request of Dr. Willia Craze for planned Left C7-T1 Cervical Interlaminar epidural steroid injection with fluoroscopic guidance.  The patient has failed conservative care including home exercise, medications, time and activity modification.  This injection will be diagnostic and hopefully therapeutic.  Please see requesting physician notes for further details and justification.  Clinically she complains of a lot of myofascial pain bilaterally that radiates up into the occiput and even towards the ear and jawline.  She reports her jaw locking up at times.  Depending on relief with injection might be more beneficial with chronic comprehensive pain management.   ROS Otherwise per HPI.  Assessment & Plan: Visit Diagnoses:    ICD-10-CM   1. Radiculopathy, cervical region  M54.12 methylPREDNISolone acetate (DEPO-MEDROL) injection 40 mg    XR C-ARM NO REPORT    Epidural Steroid injection      Plan: No additional findings.   Meds & Orders:  Meds ordered this encounter  Medications   methylPREDNISolone acetate (DEPO-MEDROL) injection 40 mg    Orders Placed This Encounter  Procedures   XR C-ARM NO REPORT   Epidural Steroid injection    Follow-up: Return for visit to requesting provider as needed.   Procedures: No procedures performed  Cervical Epidural Steroid Injection - Interlaminar Approach with Fluoroscopic Guidance  Patient: Tonya Chase      Date of Birth: Sep 11, 1970 MRN: [id number] PCP: Riki Rusk, DO      Visit Date: 03/26/2023   Universal Protocol:    Date/Time: 10/21/247:55 PM  Consent Given By: the patient  Position:  PRONE  Additional Comments: Vital signs were monitored before and after the procedure. Patient was prepped and draped in the usual sterile fashion. The correct patient, procedure, and site was verified.   Injection Procedure Details:   Procedure diagnoses: Radiculopathy, cervical region [M54.12]    Meds Administered:  Meds ordered this encounter  Medications   methylPREDNISolone acetate (DEPO-MEDROL) injection 40 mg     Laterality: Left  Location/Site: C7-T1  Needle: 3.5 in., 20 ga. Tuohy  Needle Placement: Paramedian epidural space  Findings:  -Comments: Excellent flow of contrast into the epidural space.  Procedure Details: Using a paramedian approach from the side mentioned above, the region overlying the inferior lamina was localized under fluoroscopic visualization and the soft tissues overlying this structure were infiltrated with 4 ml. of 1% Lidocaine without Epinephrine. A # 20 gauge, Tuohy needle was inserted into the epidural space using a paramedian approach.  The epidural space was localized using loss of resistance along with contralateral oblique bi-planar fluoroscopic views.  After negative aspirate for air, blood, and CSF, a 2 ml. volume of Isovue-250 was injected into the epidural space and the flow of contrast was observed. Radiographs were obtained for documentation purposes.   The injectate was administered into the level noted above.  Additional Comments:  No complications occurred Dressing: 2 x 2 sterile gauze and Band-Aid    Post-procedure details: Patient was observed during the procedure. Post-procedure instructions were reviewed.  Patient left the clinic in stable condition.   Clinical History: MRI CERVICAL SPINE WITHOUT CONTRAST   TECHNIQUE: Multiplanar,  multisequence MR imaging of the cervical spine was performed. No intravenous contrast was administered.   COMPARISON:  Cervical spine radiographs 01/14/2021. Head CT 03/29/2020.    FINDINGS: Alignment: Straightening and mild reversal of cervical lordosis, increased compared to the July radiographs. No significant spondylolisthesis.   Vertebrae: Degenerative vertebral body and endplate marrow signal changes C4 through C6. Normal background bone marrow signal. No marrow edema or evidence of acute osseous abnormality.   Cord: No cervical cord signal abnormality despite some degenerative cord mass effect detailed below.   Posterior Fossa, vertebral arteries, paraspinal tissues: Cervicomedullary junction is within normal limits. Negative visible posterior fossa. Preserved major vascular flow voids in the neck. Negative visible neck soft tissues, lung apices.   Disc levels:   C2-C3: Subtle right paracentral disc protrusion and/or annular fissure on series 5, image 13. No stenosis.   C3-C4: Mild to moderate facet hypertrophy greater on the left. No spinal stenosis. Mild to moderate left C4 foraminal stenosis.   C4-C5: Mild disc space loss with anterior eccentric bulky disc osteophyte complex. But bilateral foraminal involvement also. Borderline spinal stenosis. Mild to moderate bilateral C5 neural foraminal stenosis.   C5-C6: Mild disc space loss. Bulky circumferential disc osteophyte complex. Broad-based posterior component with small annular fissure in the midline (series 5, image 26). Mild spinal stenosis and spinal cord mass effect. Moderate left, mild to moderate right C6 foraminal stenosis.   C6-C7: Circumferential disc osteophyte complex with broad-based posterior component. Mild ligament flavum hypertrophy. Borderline to mild spinal stenosis. No cord mass effect. But moderate to severe bilateral C7 foraminal stenosis, perhaps greater on the right.   C7-T1:  Subtle disc bulging. Mild facet hypertrophy. No stenosis.   No visible upper thoracic spinal stenosis.   IMPRESSION: 1. 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  Mild spinal stenosis with up to mild spinal cord mass effect at C5-C6, but no spinal cord signal abnormality.   2. Associated moderate or severe neural foraminal stenosis at the left C6 and bilateral C7 nerve levels. And up to moderate bilateral C5 and C6 foraminal stenosis.     Electronically Signed   By: Odessa Fleming M.D.   On: 04/29/2021 09:10     Objective:  VS:  HT:    WT:   BMI:     BP:(!) 146/82  HR:67bpm  TEMP: ( )  RESP:  Physical Exam Vitals and nursing note reviewed.  Constitutional:      General: She is not in acute distress.    Appearance: Normal appearance. She is not ill-appearing.  HENT:     Head: Normocephalic and atraumatic.     Right Ear: External ear normal.     Left Ear: External ear normal.  Eyes:     Extraocular Movements: Extraocular movements intact.  Cardiovascular:     Rate and Rhythm: Normal rate.     Pulses: Normal pulses.  Musculoskeletal:     Cervical back: Tenderness present. No rigidity.     Right lower leg: No edema.     Left lower leg: No edema.     Comments: Patient has good strength in the upper extremities including 5 out of 5 strength in wrist extension long finger flexion and APB.  There is no atrophy of the hands intrinsically.  There is a negative Hoffmann's test.   Lymphadenopathy:     Cervical: No cervical adenopathy.  Skin:    Findings: No erythema, lesion or rash.  Neurological:     General: No focal  deficit present.     Mental Status: She is alert and oriented to person, place, and time.     Sensory: No sensory deficit.     Motor: No weakness or abnormal muscle tone.     Coordination: Coordination normal.  Psychiatric:        Mood and Affect: Mood normal.        Behavior: Behavior normal.      Imaging: No results found.

## 2023-04-23 ENCOUNTER — Ambulatory Visit: Payer: 59 | Admitting: Orthopedic Surgery

## 2023-04-30 ENCOUNTER — Ambulatory Visit: Payer: 59 | Admitting: Orthopedic Surgery

## 2023-05-08 ENCOUNTER — Telehealth: Payer: Self-pay

## 2023-05-08 NOTE — Telephone Encounter (Signed)
Patient called into the office and would like another injection.  She said this injection lasted about 8 days.  She is in a lot of pain to the point she has to leave her job early.  Pain is a 10 No new injury.  Pain is in the top of her shoulders and going up to her ears making her ears hurt.

## 2023-05-09 ENCOUNTER — Other Ambulatory Visit: Payer: Self-pay | Admitting: Physical Medicine and Rehabilitation

## 2023-05-13 ENCOUNTER — Telehealth: Payer: Self-pay | Admitting: Physical Medicine and Rehabilitation

## 2023-05-13 NOTE — Telephone Encounter (Signed)
Patient called and wanted to know if she could get an early or later appointment. CB#[phone]

## 2023-05-14 ENCOUNTER — Ambulatory Visit: Payer: 59 | Admitting: Physical Medicine and Rehabilitation

## 2023-05-22 ENCOUNTER — Ambulatory Visit: Payer: 59 | Admitting: Orthopedic Surgery

## 2023-06-16 ENCOUNTER — Ambulatory Visit: Payer: 59 | Admitting: Orthopedic Surgery

## 2023-06-16 DIAGNOSIS — M5412 Radiculopathy, cervical region: Secondary | ICD-10-CM | POA: Diagnosis not present

## 2023-06-16 NOTE — Progress Notes (Signed)
Orthopedic Spine Surgery Office Note   Assessment: Patient is a 52 y.o. female with neck pain that radiates into bilateral shoulders, arms, and dorsal forearms (L>R).  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 I repeated that I would not prescribe any narcotics except to control post-operative pain. She is now established with pain management and has been getting percocet -Ordered an MRI of the cervical spine since she has tried over 6 weeks of conservative treatment without relief, she has developed new symptoms, and her last MRI is from 2022 -She got good relief with her prior injection and was interested in another, so a referral was provided to her today -Would need to be nicotine free prior to any elective spine surgery. I explained that I would test her for nicotine prior to any surgery. She said she will work on quitting -Patient should return to office in 4-5 weeks, x-rays at next visit: none     Patient expressed understanding of the plan and all questions were answered to the patient's satisfaction.    ___________________________________________________________________________     History:   Patient is a 52 y.o. female who presents today for follow up on her cervical spine.  Patient continues to have significant neck pain that radiates into her bilateral upper extremities.  She describes her pain radiating into her bilateral shoulders, lateral arms, and dorsal forearms.  It does not radiate into the hands.  She states that she did well with her injection.  She said she got 80 to 90% relief of her neck and arm pain with the injection but then pain returned.  She reports a history of fibromyalgia and states that she has got multiple other joints that are painful, but her most painful thing is the neck pain radiating to her upper extremities.   Denies paresthesias and numbness.   Treatments tried: Gabapentin, Lyrica, medrol dose pak, narcotics, cervical ESI     Physical Exam:   General: no acute distress, appears stated age Neurologic: alert, answering questions appropriately, following commands Respiratory: unlabored breathing on room air, symmetric chest rise Psychiatric: appropriate affect, normal cadence to speech     MSK (spine):   -Strength exam                                                   Left                  Right Grip strength                5/5                  5/5 Interosseus                  5/5                  5/5 Wrist extension            5/5                  5/5 Wrist flexion                 5/5  5/5 Elbow flexion                5/5                  5/5 Deltoid                          5/5                  5/5   -Sensory exam                         Sensation intact to light touch in C5-T1 nerve distributions of bilateral upper extremities   -Brachioradialis DTR: 2/4 on the left, 2/4 on the right -Biceps DTR: 2/4 on the left, 2/4 on the right   -Spurling: Negative bilaterally -Hoffman sign: Negative bilaterally -Clonus: No beats bilaterally -Interosseous wasting: None seen -Grip and release test: Negative -Gait: Normal   Left shoulder exam: negative Jobe, negative belly press, no weakness with external rotation with arm at side, no pain through range of motion Right shoulder exam: negative Jobe, negative belly press, no weakness with external rotation with arm at side, no pain through range of motion   Tinel's at wrist: Negative bilaterally Phalen's at wrist: Negative bilaterally Durkan's: Negative bilaterally   Tinel's at elbow: Negative bilaterally   Imaging: XRs of the cervical spine from 03/13/2023 were previously independently reviewed and interpreted, showing disc height loss at C4/5, C5/6, C6/7. Neutral alignment. No fracture or dislocation seen. No evidence of  instability on flexion/extension views.    MRI of the cervical spine from 04/28/2021 was previously independently reviewed and interpreted, showing bilateral foraminal stenosis (L>R) at C4/5 and C5/6. Bilateral foraminal stenosis at C6/7. No significant central stenosis. No T2 cord signal change.      Patient name: Tonya Chase Patient MRN: [id number] Date of visit: 06/16/23

## 2023-06-26 ENCOUNTER — Other Ambulatory Visit: Payer: 59

## 2023-07-02 NOTE — Discharge Instructions (Signed)

## 2023-07-03 ENCOUNTER — Ambulatory Visit
Admission: RE | Admit: 2023-07-03 | Discharge: 2023-07-03 | Disposition: A | Discharge status: Home / Self Care | Payer: 59 | Source: Ambulatory Visit | Attending: Orthopedic Surgery | Admitting: Orthopedic Surgery

## 2023-07-03 DIAGNOSIS — M5412 Radiculopathy, cervical region: Secondary | ICD-10-CM

## 2023-07-03 MED ORDER — IOPAMIDOL (ISOVUE-M 300) INJECTION 61%
1.0000 mL | Freq: Once | INTRAMUSCULAR | Status: AC
  Administered 2023-07-03: 1 mL via EPIDURAL

## 2023-07-03 MED ORDER — TRIAMCINOLONE ACETONIDE 40 MG/ML IJ SUSP (RADIOLOGY)
60.0000 mg | Freq: Once | INTRAMUSCULAR | Status: AC
  Administered 2023-07-03: 60 mg via EPIDURAL

## 2023-07-04 ENCOUNTER — Ambulatory Visit
Admission: RE | Admit: 2023-07-04 | Discharge: 2023-07-04 | Disposition: A | Discharge status: Home / Self Care | Payer: 59 | Source: Ambulatory Visit | Attending: Orthopedic Surgery | Admitting: Orthopedic Surgery

## 2023-07-04 DIAGNOSIS — M5412 Radiculopathy, cervical region: Secondary | ICD-10-CM

## 2023-07-24 ENCOUNTER — Telehealth: Payer: Self-pay | Admitting: Physical Medicine and Rehabilitation

## 2023-07-24 NOTE — Telephone Encounter (Signed)
 Patient called needing to schedule an appointment with Dr. Daisey Dryer for her back.  Patient asked if she could get a call back today. The number to contact patient is [phone]

## 2023-07-31 NOTE — Telephone Encounter (Signed)
Scheduled 08/05/23.

## 2023-08-05 ENCOUNTER — Encounter: Payer: Self-pay | Admitting: Orthopedic Surgery

## 2023-08-05 ENCOUNTER — Ambulatory Visit: Payer: 59 | Admitting: Orthopedic Surgery

## 2023-08-21 ENCOUNTER — Ambulatory Visit: Payer: 59 | Admitting: Orthopedic Surgery

## 2023-09-05 ENCOUNTER — Telehealth: Payer: Self-pay | Admitting: Orthopedic Surgery

## 2023-09-05 NOTE — Telephone Encounter (Signed)
 Pt request a new referral be sent to [facility] for her to get another injection

## 2023-09-09 ENCOUNTER — Telehealth: Payer: Self-pay | Admitting: Orthopedic Surgery

## 2023-09-09 NOTE — Telephone Encounter (Signed)
 I called and advised patient that she has been dismissed from Dr. Christell Constant due to the number of No show appointments she had with him. She states that she never rec'd the letter, I advised that I would reprint it and mail it out to her today. I advised that she should contact her PCP to see who the recommend her seeing for her neck.

## 2023-09-09 NOTE — Telephone Encounter (Signed)
 I tried to call patient to advise that she has been dismissed from Dr. Kathi Der care due to 3 no show appointments, however the call could not be completed at this time.

## 2023-09-09 NOTE — Telephone Encounter (Signed)
 Patient called and said she is waiting on him to send the referral to [facility]. CB#[phone]

## 2023-09-23 ENCOUNTER — Other Ambulatory Visit: Payer: Self-pay | Admitting: Nurse Practitioner

## 2023-09-23 DIAGNOSIS — Z1231 Encounter for screening mammogram for malignant neoplasm of breast: Secondary | ICD-10-CM

## 2023-10-22 ENCOUNTER — Ambulatory Visit

## 2023-10-28 ENCOUNTER — Ambulatory Visit

## 2023-10-30 ENCOUNTER — Ambulatory Visit

## 2023-11-20 ENCOUNTER — Encounter: Payer: Self-pay | Admitting: Orthopedic Surgery

## 2023-11-20 ENCOUNTER — Ambulatory Visit

## 2023-11-20 ENCOUNTER — Telehealth: Payer: Self-pay | Admitting: Orthopedic Surgery
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
## 2023-11-24 ENCOUNTER — Ambulatory Visit

## 2023-12-03 ENCOUNTER — Ambulatory Visit
Admission: RE | Admit: 2023-12-03 | Discharge: 2023-12-03 | Disposition: A | Discharge status: Home / Self Care | Source: Ambulatory Visit | Attending: Orthopedic Surgery

## 2024-04-19 ENCOUNTER — Encounter: Payer: Self-pay | Admitting: Radiology
